# Patient Record
Sex: Female | Born: 1975 | Race: Black or African American | Hispanic: No | Marital: Married | Smoking: Never smoker
Health system: Southern US, Community
[De-identification: ages and names within clinical notes are randomized; demographics above are authoritative.]

## PROBLEM LIST (undated history)

## (undated) DIAGNOSIS — I10 Essential (primary) hypertension: Secondary | ICD-10-CM

## (undated) HISTORY — DX: Essential (primary) hypertension: I10

---

## 2011-06-18 NOTE — L&D Delivery Note (Signed)
Delivery Note At 4:31 AM a viable and healthy female was delivered via Vaginal, Spontaneous Delivery (Presentation: Left Occiput Anterior).  APGAR: 9, 9; weight .   No difficulty with shoulders.  Placenta status: , Spontaneous.  Cord: 3 vessels with the following complications: None.    Anesthesia: None  Episiotomy: None Lacerations: None Suture Repair:   Est. Blood Loss (mL): 200  Mom to postpartum.  Baby to nursery-stable.  [facility] 12/19/2011, 4:51 AM

## 2011-07-08 ENCOUNTER — Emergency Department (HOSPITAL_COMMUNITY)
Admission: EM | Admit: 2011-07-08 | Discharge: 2011-07-08 | Disposition: A | Discharge status: Home / Self Care | Payer: Medicaid Other | Source: Home / Self Care | Attending: Emergency Medicine | Admitting: Emergency Medicine

## 2011-07-08 ENCOUNTER — Encounter (HOSPITAL_COMMUNITY): Payer: Self-pay | Admitting: *Deleted

## 2011-07-08 DIAGNOSIS — J069 Acute upper respiratory infection, unspecified: Secondary | ICD-10-CM

## 2011-07-08 DIAGNOSIS — O219 Vomiting of pregnancy, unspecified: Secondary | ICD-10-CM

## 2011-07-08 LAB — POCT URINALYSIS DIP (DEVICE)
Glucose, UA: NEGATIVE mg/dL
Leukocytes, UA: NEGATIVE
Nitrite: NEGATIVE

## 2011-07-08 LAB — URINE CULTURE: Colony Count: 30000

## 2011-07-08 LAB — POCT I-STAT, CHEM 8
HCT: 35 % — ABNORMAL LOW (ref 36.0–46.0)
Hemoglobin: 11.9 g/dL — ABNORMAL LOW (ref 12.0–15.0)
Potassium: 3.7 mEq/L (ref 3.5–5.1)
Sodium: 138 mEq/L (ref 135–145)
TCO2: 23 mmol/L (ref 0–100)

## 2011-07-08 MED ORDER — ONDANSETRON 8 MG PO TBDP: 8.0000 mg | ORAL_TABLET | Freq: Three times a day (TID) | ORAL | Status: AC | PRN

## 2011-07-08 NOTE — ED Provider Notes (Signed)
History     CSN: [id number]  Arrival date & time 07/08/11  1427   First MD Initiated Contact with Patient 07/08/11 1627      Chief Complaint  Patient presents with  . Emesis During Pregnancy    (Consider location/radiation/quality/duration/timing/severity/associated sxs/prior treatment) HPI Comments: Andrea Lucas is 4 months pregnant. She doesn't have an obstetrician and has gotten no prenatal care so far, but does have an appointment to fill out some paperwork on January 31. Ever since the onset of her pregnancy she's had vomiting in the morning and after she eats. Despite this hasn't lost any weight in fact she has been gaining. She had not had any prior history of vomiting up blood, this morning she vomited several times and then vomited up about a teaspoon of bright red blood. She's not had any vomiting of blood since then and denies any abdominal pain or melena.  Also for the past 2 weeks she's had some mild URI symptoms with initially some nasal congestion, rhinorrhea, and scratchy throat. Now she is just left with a dry cough but no wheezing or shortness of breath. She has a headache whenever she coughs. For the past week she's had some pain in her posterior rib cages bilaterally and bilateral flanks.   History reviewed. No pertinent past medical history.  History reviewed. No pertinent past surgical history.  History reviewed. No pertinent family history.  History  Substance Use Topics  . Smoking status: Not on file  . Smokeless tobacco: Not on file  . Alcohol Use:     OB History    Grav Para Term Preterm Abortions TAB SAB Ect Mult Living   1               Review of Systems  Constitutional: Negative for fever, chills, appetite change, fatigue and unexpected weight change.  HENT: Negative for ear pain, congestion, sore throat, rhinorrhea, sneezing, neck stiffness, voice change and postnasal drip.   Eyes: Negative for pain, discharge and redness.  Respiratory: Positive  for cough. Negative for chest tightness, shortness of breath and wheezing.   Cardiovascular: Negative for chest pain.  Gastrointestinal: Positive for nausea and vomiting. Negative for abdominal pain, diarrhea, constipation, blood in stool, abdominal distention, anal bleeding and rectal pain.  Genitourinary: Negative for dysuria, urgency and frequency.  Skin: Negative for rash.  Neurological: Positive for headaches.    Allergies  Review of patient's allergies indicates no known allergies.  Home Medications   Current Outpatient Rx  Name Route Sig Dispense Refill  . ONDANSETRON 8 MG PO TBDP Oral Take 1 tablet (8 mg total) by mouth every 8 (eight) hours as needed for nausea. 20 tablet 0    BP 140/70  Pulse 108  Temp(Src) 98.9 F (37.2 C) (Oral)  Resp 20  SpO2 100%  Filed Vitals:   07/08/11 1553 07/08/11 1731 07/08/11 1732 07/08/11 1734  BP: 137/84 129/80 136/77 140/70  Pulse: 102 91 90 108  Temp: 98.9 F (37.2 C)     TempSrc: Oral     Resp: 20     SpO2: 100%       Physical Exam  Nursing note and vitals reviewed. Constitutional: She appears well-developed and well-nourished. No distress.  HENT:  Head: Normocephalic and atraumatic.  Right Ear: External ear normal.  Left Ear: External ear normal.  Nose: Nose normal.  Mouth/Throat: Oropharynx is clear and moist. No oropharyngeal exudate.  Eyes: Conjunctivae and EOM are normal. Pupils are equal, round, and reactive to light.  Right eye exhibits no discharge. Left eye exhibits no discharge. No scleral icterus.  Neck: Normal range of motion. Neck supple.  Cardiovascular: Normal rate, regular rhythm and normal heart sounds.  Exam reveals no gallop and no friction rub.   No murmur heard. Pulmonary/Chest: Effort normal and breath sounds normal. No stridor. No respiratory distress. She has no wheezes. She has no rales. She exhibits no tenderness.  Abdominal: Soft. Bowel sounds are normal. She exhibits mass (there is a gravid uterus  palpable just below the umbilicus at the midline. This is nontender.). She exhibits no distension. There is no hepatosplenomegaly. There is no tenderness. There is no rebound, no guarding and no CVA tenderness.  Genitourinary:       Digital rectal exam reveals no masses or tenderness and heme-negative stool.  Lymphadenopathy:    She has no cervical adenopathy.  Skin: Skin is warm and dry. No rash noted. She is not diaphoretic.    ED Course  Procedures (including critical care time)  Results for orders placed during the hospital encounter of 07/08/11  POCT URINALYSIS DIP (DEVICE)      Component Value Range   Glucose, UA NEGATIVE  NEGATIVE (mg/dL)   Bilirubin Urine SMALL (*) NEGATIVE    Ketones, ur TRACE (*) NEGATIVE (mg/dL)   Specific Gravity, Urine 1.025  1.005 - 1.030    Hgb urine dipstick NEGATIVE  NEGATIVE    pH 7.0  5.0 - 8.0    Protein, ur 30 (*) NEGATIVE (mg/dL)   Urobilinogen, UA 2.0 (*) 0.0 - 1.0 (mg/dL)   Nitrite NEGATIVE  NEGATIVE    Leukocytes, UA NEGATIVE  NEGATIVE   POCT PREGNANCY, URINE      Component Value Range   Preg Test, Ur POSITIVE    POCT I-STAT, CHEM 8      Component Value Range   Sodium 138  135 - 145 (mEq/L)   Potassium 3.7  3.5 - 5.1 (mEq/L)   Chloride 103  96 - 112 (mEq/L)   BUN 3 (*) 6 - 23 (mg/dL)   Creatinine, Ser 1.61  0.50 - 1.10 (mg/dL)   Glucose, Bld 096 (*) 70 - 99 (mg/dL)   Calcium, Ion 0.45  4.09 - 1.32 (mmol/L)   TCO2 23  0 - 100 (mmol/L)   Hemoglobin 11.9 (*) 12.0 - 15.0 (g/dL)   HCT 81.1 (*) 91.4 - 46.0 (%)  OCCULT BLOOD, POC DEVICE      Component Value Range   Fecal Occult Bld NEGATIVE       Labs Reviewed  POCT URINALYSIS DIP (DEVICE) - Abnormal; Notable for the following:    Bilirubin Urine SMALL (*)    Ketones, ur TRACE (*)    Protein, ur 30 (*)    Urobilinogen, UA 2.0 (*)    All other components within normal limits  POCT I-STAT, CHEM 8 - Abnormal; Notable for the following:    BUN 3 (*)    Glucose, Bld 109 (*)     Hemoglobin 11.9 (*)    HCT 35.0 (*)    All other components within normal limits  POCT PREGNANCY, URINE  OCCULT BLOOD, POC DEVICE  I-STAT, CHEM 8  POCT URINALYSIS DIPSTICK  URINE CULTURE  POCT OCCULT BLOOD STOOL, DEVICE   No results found.   1. Upper respiratory infection   2. Pregnancy, excessive vomiting       MDM  There is no evidence that she has any significant GI bleeding based on heme negative stools, near-normal hemoglobin and hematocrit  on her i-STAT, and no postural drop. The blood was probably due to hyperemesis gravidarum, which may cause rupture of a small blood vessel or a tiny tear in her esophagus. She was told that they should get worse or persist she should go directly to the emergency room.  She also appears to have an upper respiratory infection. Since she is pregnant I am not inclined to give her any cough medication but have suggested a couple of over-the-counter things that she could take that would be harmful to take during pregnancy.        Roque Lias, MD 07/08/11 2055

## 2011-07-08 NOTE — ED Notes (Signed)
pT  IS  4  MONTHS  PREGNANT  SHE  REPORTS  SYMPTOMS  OF  NAUSEA      LOW  BACK /  FLANK  PAINS  AS  WELL  AS  LINGERING  SYMPTOMS  OF  CONGESTION  AND  URI  SYMPTOMS      PT  REPORTS  HAS  BEEN  SYMPTAMATIC  FOR  2  WEEKS

## 2011-07-20 LAB — RUBELLA ANTIBODY, IGM: Rubella: IMMUNE

## 2011-07-24 ENCOUNTER — Other Ambulatory Visit (HOSPITAL_COMMUNITY): Payer: Self-pay | Admitting: Family Medicine

## 2011-07-24 ENCOUNTER — Other Ambulatory Visit: Payer: Self-pay | Admitting: Family Medicine

## 2011-07-24 DIAGNOSIS — Z3689 Encounter for other specified antenatal screening: Secondary | ICD-10-CM

## 2011-07-24 DIAGNOSIS — O09529 Supervision of elderly multigravida, unspecified trimester: Secondary | ICD-10-CM

## 2011-07-24 LAB — ABO/RH: RH Type: POSITIVE

## 2011-07-24 LAB — CBC: Platelets: 253 10*3/uL (ref 150–399)

## 2011-07-24 LAB — GC/CHLAMYDIA PROBE AMP, GENITAL
Chlamydia: NEGATIVE
Gonorrhea: NEGATIVE

## 2011-07-24 LAB — ANTIBODY SCREEN: Antibody Screen: NEGATIVE

## 2011-07-25 LAB — HIV ANTIBODY (ROUTINE TESTING W REFLEX): HIV: NONREACTIVE

## 2011-07-26 ENCOUNTER — Other Ambulatory Visit: Payer: Self-pay

## 2011-07-26 LAB — CHG HEMOGLOBIN ELECTROPHORESIS

## 2011-07-31 ENCOUNTER — Ambulatory Visit (HOSPITAL_COMMUNITY)
Admission: RE | Admit: 2011-07-31 | Discharge: 2011-07-31 | Disposition: A | Discharge status: Home / Self Care | Payer: Medicaid Other | Source: Ambulatory Visit | Attending: Family Medicine | Admitting: Family Medicine

## 2011-07-31 DIAGNOSIS — O358XX0 Maternal care for other (suspected) fetal abnormality and damage, not applicable or unspecified: Secondary | ICD-10-CM | POA: Insufficient documentation

## 2011-07-31 DIAGNOSIS — Z363 Encounter for antenatal screening for malformations: Secondary | ICD-10-CM | POA: Insufficient documentation

## 2011-07-31 DIAGNOSIS — O10019 Pre-existing essential hypertension complicating pregnancy, unspecified trimester: Secondary | ICD-10-CM | POA: Insufficient documentation

## 2011-07-31 DIAGNOSIS — Z3689 Encounter for other specified antenatal screening: Secondary | ICD-10-CM

## 2011-07-31 DIAGNOSIS — O09529 Supervision of elderly multigravida, unspecified trimester: Secondary | ICD-10-CM

## 2011-07-31 DIAGNOSIS — O0930 Supervision of pregnancy with insufficient antenatal care, unspecified trimester: Secondary | ICD-10-CM | POA: Insufficient documentation

## 2011-07-31 DIAGNOSIS — E669 Obesity, unspecified: Secondary | ICD-10-CM | POA: Insufficient documentation

## 2011-07-31 DIAGNOSIS — Z1389 Encounter for screening for other disorder: Secondary | ICD-10-CM | POA: Insufficient documentation

## 2011-07-31 NOTE — Progress Notes (Addendum)
Genetic Counseling  High-Risk Gestation Note  Appointment Date:  07/31/2011 Referred By: Reva Bores, MD Date of Birth:  1975/08/25   Pregnancy History: G1P0 Estimated Date of Delivery: 12/23/11 Estimated Gestational Age: [redacted]w[redacted]d  Ms. Andrea Lucas was seen for genetic counseling regarding a maternal age of 36 y.o.  She was counseled regarding maternal age and the association with risk for chromosome conditions due to nondisjunction with aging of the ova.   We reviewed chromosomes, nondisjunction, and the associated 1 in 100 risk for fetal aneuploidy related to a maternal age of 36 y.o. at [redacted]w[redacted]d gestation.  She was counseled that the risk for aneuploidy decreases as gestational age increases, accounting for those pregnancies which spontaneously abort.  We specifically discussed Down syndrome (trisomy 41), trisomies 74 and 78, and sex chromosome aneuploidies (47,XXX and 47,XXY) including the common features and prognoses of each.   We also reviewed [name]'s maternal serum Quad screen result and the associated reduction in risks for fetal Down syndrome (1 in 279 to 1 in 10,000), trisomy 18 (1 in 840 to 1 in 10,000), and ONTDs.  She understands that Quad screening provides a pregnancy specific risk for Down syndrome, but is not considered to be diagnostic.  She was counseled regarding other available screening and diagnostic options including ultrasound, cell free fetal DNA testing, and amniocentesis.  The risks, benefits, and limitations of each of these options were reviewed in detail.  After thoughtful consideration of these options, she elected to proceed with detailed ultrasound, but declined cell free fetal DNA testing and amniocentesis.  She understands that screening tests cannot rule out all birth defects or genetic syndromes.  The patient was advised of this limitation and states she still does not want diagnostic testing at this time.  However, she was counseled that 50-80% of fetuses with  Down syndrome and up to 90% of fetuses with trisomies 13 and 18, when well visualized, have detectable anomalies or soft markers by ultrasound. A complete detailed ultrasound was performed today.  The ultrasound report will be documented separately.  [name] was provided with written information regarding sickle cell anemia (SCA) including the carrier frequency and incidence in the African-American population, the availability of carrier testing and prenatal diagnosis if indicated.  In addition, we discussed that hemoglobinopathies are routinely screened for as part of the [place] newborn screening panel.  She declined hemoglobin electrophoresis today.   Both family histories were reviewed and found to be noncontributory for birth defects, mental retardation, and known genetic conditions. Without further information regarding the provided family history, an accurate genetic risk cannot be calculated. Further genetic counseling is warranted if more information is obtained.  [name] denied exposure to environmental toxins or chemical agents. She denied the use of alcohol, tobacco or street drugs. She denied significant viral illnesses during the course of her pregnancy.   I counseled [name] regarding the above risks and available options.  The approximate face-to-face time with the genetic counselor was 38 minutes.  Despina Arias, MS Certified Genetic Counselor

## 2011-08-06 DIAGNOSIS — O09529 Supervision of elderly multigravida, unspecified trimester: Secondary | ICD-10-CM

## 2011-08-06 DIAGNOSIS — O0990 Supervision of high risk pregnancy, unspecified, unspecified trimester: Secondary | ICD-10-CM | POA: Insufficient documentation

## 2011-08-06 DIAGNOSIS — O169 Unspecified maternal hypertension, unspecified trimester: Secondary | ICD-10-CM | POA: Insufficient documentation

## 2011-08-06 DIAGNOSIS — I10 Essential (primary) hypertension: Secondary | ICD-10-CM

## 2011-08-06 NOTE — Assessment & Plan Note (Signed)
Declined quad screen 1 hour glucose 124 Declined flu vaccine

## 2011-08-08 ENCOUNTER — Ambulatory Visit (INDEPENDENT_AMBULATORY_CARE_PROVIDER_SITE_OTHER): Payer: Medicaid Other | Admitting: Physician Assistant

## 2011-08-08 ENCOUNTER — Encounter: Payer: Self-pay | Admitting: Physician Assistant

## 2011-08-08 DIAGNOSIS — K219 Gastro-esophageal reflux disease without esophagitis: Secondary | ICD-10-CM

## 2011-08-08 DIAGNOSIS — O169 Unspecified maternal hypertension, unspecified trimester: Secondary | ICD-10-CM

## 2011-08-08 DIAGNOSIS — O09529 Supervision of elderly multigravida, unspecified trimester: Secondary | ICD-10-CM

## 2011-08-08 DIAGNOSIS — I10 Essential (primary) hypertension: Secondary | ICD-10-CM

## 2011-08-08 LAB — POCT URINALYSIS DIP (DEVICE)
Bilirubin Urine: NEGATIVE
Glucose, UA: NEGATIVE mg/dL
Leukocytes, UA: NEGATIVE
Nitrite: NEGATIVE
Urobilinogen, UA: 1 mg/dL (ref 0.0–1.0)
pH: 6.5 (ref 5.0–8.0)

## 2011-08-08 NOTE — Progress Notes (Signed)
Transfer of care from [facility] secondary to chronic HTN. No medications. No complaints. + FM, Baseline labs today. Rec: 1-2 x weekly checking BP at [facility]. Report BP > 150/100

## 2011-08-08 NOTE — Progress Notes (Signed)
P=91, given influenza information

## 2011-08-08 NOTE — Patient Instructions (Signed)
Hypertension As your heart beats, it forces blood through your arteries. This force is your blood pressure. If the pressure is too high, it is called hypertension (HTN) or high blood pressure. HTN is dangerous because you may have it and not know it. High blood pressure may mean that your heart has to work harder to pump blood. Your arteries may be narrow or stiff. The extra work puts you at risk for heart disease, stroke, and other problems.  Blood pressure consists of two numbers, a higher number over a lower, 110/72, for example. It is stated as "110 over 72." The ideal is below 120 for the top number (systolic) and under 80 for the bottom (diastolic). Write down your blood pressure today. You should pay close attention to your blood pressure if you have certain conditions such as:  Heart failure.   Prior heart attack.   Diabetes   Chronic kidney disease.   Prior stroke.   Multiple risk factors for heart disease.  To see if you have HTN, your blood pressure should be measured while you are seated with your arm held at the level of the heart. It should be measured at least twice. A one-time elevated blood pressure reading (especially in the Emergency Department) does not mean that you need treatment. There may be conditions in which the blood pressure is different between your right and left arms. It is important to see your caregiver soon for a recheck. Most people have essential hypertension which means that there is not a specific cause. This type of high blood pressure may be lowered by changing lifestyle factors such as:  Stress.   Smoking.   Lack of exercise.   Excessive weight.   Drug/tobacco/alcohol use.   Eating less salt.  Most people do not have symptoms from high blood pressure until it has caused damage to the body. Effective treatment can often prevent, delay or reduce that damage. TREATMENT  When a cause has been identified, treatment for high blood pressure is  directed at the cause. There are a large number of medications to treat HTN. These fall into several categories, and your caregiver will help you select the medicines that are best for you. Medications may have side effects. You should review side effects with your caregiver. If your blood pressure stays high after you have made lifestyle changes or started on medicines,   Your medication(s) may need to be changed.   Other problems may need to be addressed.   Be certain you understand your prescriptions, and know how and when to take your medicine.   Be sure to follow up with your caregiver within the time frame advised (usually within two weeks) to have your blood pressure rechecked and to review your medications.   If you are taking more than one medicine to lower your blood pressure, make sure you know how and at what times they should be taken. Taking two medicines at the same time can result in blood pressure that is too low.  SEEK IMMEDIATE MEDICAL CARE IF:  You develop a severe headache, blurred or changing vision, or confusion.   You have unusual weakness or numbness, or a faint feeling.   You have severe chest or abdominal pain, vomiting, or breathing problems.  MAKE SURE YOU:   Understand these instructions.   Will watch your condition.   Will get help right away if you are not doing well or get worse.  Document Released: 06/03/2005 Document Revised: 02/13/2011 Document Reviewed:   01/22/2008 [employer] Patient Information 2012 [employer].24-Hour Urine Collection HOME CARE  When you get up in the morning on the day you do this test, pee (urinate) in the toilet and flush. Make a note of the time. This will be your start time on the day of collection and the end time on the next morning.   From then on, save all your pee (urine) in the plastic jug that was given to you.   You should stop collecting your pee 24 hours after you started.   If the plastic jug that is given to  you already has liquid in it, that is okay. Do not throw out the liquid or rinse out the jug. Some tests need the liquid to be added to your pee.   Keep your plastic jug cool (in an ice chest or the refrigerator) during the test.   When the 24 hours is over, bring your plastic jug to the clinic lab. Keep the jug cool (in an ice chest) while you are bringing it to the lab.  Document Released: 08/30/2008 Document Revised: 02/13/2011 Document Reviewed: 08/30/2008 [facility] Patient Information 2012 [place], [place].

## 2011-08-12 ENCOUNTER — Other Ambulatory Visit: Payer: Medicaid Other

## 2011-08-12 DIAGNOSIS — O09529 Supervision of elderly multigravida, unspecified trimester: Secondary | ICD-10-CM

## 2011-08-12 DIAGNOSIS — I10 Essential (primary) hypertension: Secondary | ICD-10-CM

## 2011-08-12 LAB — CBC
HCT: 29.1 % — ABNORMAL LOW (ref 36.0–46.0)
Hemoglobin: 9 g/dL — ABNORMAL LOW (ref 12.0–15.0)
MCV: 80.8 fL (ref 78.0–100.0)
RDW: 13.7 % (ref 11.5–15.5)
WBC: 7.3 10*3/uL (ref 4.0–10.5)

## 2011-08-12 LAB — COMPREHENSIVE METABOLIC PANEL
Albumin: 3.6 g/dL (ref 3.5–5.2)
BUN: 7 mg/dL (ref 6–23)
CO2: 23 mEq/L (ref 19–32)
Calcium: 9 mg/dL (ref 8.4–10.5)
Chloride: 105 mEq/L (ref 96–112)
Creat: 0.55 mg/dL (ref 0.50–1.10)
Glucose, Bld: 87 mg/dL (ref 70–99)
Potassium: 3.9 mEq/L (ref 3.5–5.3)

## 2011-08-13 LAB — CREATININE CLEARANCE, URINE, 24 HOUR
Creatinine Clearance: 277 mL/min — ABNORMAL HIGH (ref 75–115)
Creatinine: 0.55 mg/dL (ref 0.50–1.10)

## 2011-08-14 DIAGNOSIS — O0990 Supervision of high risk pregnancy, unspecified, unspecified trimester: Secondary | ICD-10-CM

## 2011-08-14 NOTE — Assessment & Plan Note (Signed)
Per Hemoglobin Electrophoresis has A+ Variant, had Negative Quad screen 07/24/11.

## 2011-08-29 ENCOUNTER — Ambulatory Visit (INDEPENDENT_AMBULATORY_CARE_PROVIDER_SITE_OTHER): Payer: Medicaid Other | Admitting: Obstetrics and Gynecology

## 2011-08-29 VITALS — BP 133/89 | HR 98 | Temp 97.3°F | Wt 209.3 lb

## 2011-08-29 DIAGNOSIS — I10 Essential (primary) hypertension: Secondary | ICD-10-CM

## 2011-08-29 DIAGNOSIS — O169 Unspecified maternal hypertension, unspecified trimester: Secondary | ICD-10-CM

## 2011-08-29 DIAGNOSIS — O0990 Supervision of high risk pregnancy, unspecified, unspecified trimester: Secondary | ICD-10-CM

## 2011-08-29 DIAGNOSIS — O09529 Supervision of elderly multigravida, unspecified trimester: Secondary | ICD-10-CM

## 2011-08-29 LAB — POCT URINALYSIS DIP (DEVICE)
Glucose, UA: NEGATIVE mg/dL
Ketones, ur: NEGATIVE mg/dL
Protein, ur: NEGATIVE mg/dL
Specific Gravity, Urine: >=1.03 (ref 1.005–1.030)

## 2011-08-29 NOTE — Progress Notes (Signed)
Patient doing well without complaints. Patient desires BTL that is 100% effective. Explained failure rate of each method. Also discussed Essure and need for confirmatory HSG which will be covered by family planning medicaid, if she enrolls following pregnancy.

## 2011-08-29 NOTE — Progress Notes (Signed)
Pulse- 98 

## 2011-09-12 ENCOUNTER — Encounter: Payer: Self-pay | Admitting: Family Medicine

## 2011-09-12 ENCOUNTER — Ambulatory Visit (INDEPENDENT_AMBULATORY_CARE_PROVIDER_SITE_OTHER): Payer: Medicaid Other | Admitting: Family Medicine

## 2011-09-12 VITALS — BP 132/80 | Wt 208.6 lb

## 2011-09-12 DIAGNOSIS — K219 Gastro-esophageal reflux disease without esophagitis: Secondary | ICD-10-CM

## 2011-09-12 DIAGNOSIS — O169 Unspecified maternal hypertension, unspecified trimester: Secondary | ICD-10-CM

## 2011-09-12 DIAGNOSIS — O09529 Supervision of elderly multigravida, unspecified trimester: Secondary | ICD-10-CM

## 2011-09-12 DIAGNOSIS — O0990 Supervision of high risk pregnancy, unspecified, unspecified trimester: Secondary | ICD-10-CM

## 2011-09-12 DIAGNOSIS — I10 Essential (primary) hypertension: Secondary | ICD-10-CM

## 2011-09-12 LAB — POCT URINALYSIS DIP (DEVICE)
Bilirubin Urine: NEGATIVE
Glucose, UA: NEGATIVE mg/dL
Hgb urine dipstick: NEGATIVE
Specific Gravity, Urine: 1.015 (ref 1.005–1.030)

## 2011-09-12 MED ORDER — RANITIDINE HCL 150 MG PO TABS: 150.0000 mg | ORAL_TABLET | Freq: Two times a day (BID) | ORAL | Status: DC

## 2011-09-12 NOTE — Progress Notes (Signed)
Heartburn worse at night, but also having some vomiting during day.  Will start Zantac.  Will check urine culture.  No other complaints.  F/U 2 weeks.

## 2011-09-12 NOTE — Progress Notes (Signed)
No vaginal discharge. Pulse 103.

## 2011-09-12 NOTE — Patient Instructions (Signed)
Heartburn During Pregnancy  Heartburn is a burning sensation in the chest caused by stomach acid backing up into the esophagus. Heartburn (also known as "reflux") is common in pregnancy because a certain hormone (progesterone) changes. The progesterone hormone may relax the valve that separates the esophagus from the stomach. This allows acid to go up into the esophagus, causing heartburn. Heartburn may also happen in pregnancy because the enlarging uterus pushes up on the stomach, which pushes more acid into the esophagus. This is especially true in the later stages of pregnancy. Heartburn problems usually go away after giving birth. CAUSES   The progesterone hormone.   Changing hormone levels.   The growing uterus that pushes stomach acid upward.   Large meals.   Certain foods and drinks.   Exercise.   Increased acid production.  SYMPTOMS   Burning pain in the chest or lower throat.   Bitter taste in the mouth.   Coughing.  DIAGNOSIS  Heartburn is typically diagnosed by your caregiver when taking a careful history of your concern. Your caregiver may order a blood test to check for a certain type of bacteria that is associated with heartburn. Sometimes, heartburn is diagnosed by prescribing a heartburn medicine to see if the symptoms improve. It is rare in pregnancy to have a procedure called an endoscopy. This is when a tube with a light and a camera on the end is used to examine the esophagus and the stomach. TREATMENT   Your caregiver may tell you to use certain over-the-counter medicines (antacids, acid reducers) for mild heartburn.   Your caregiver may prescribe medicines to decrease stomach acid or to protect your stomach lining.   Your caregiver may recommend certain diet changes.   For severe cases, your caregiver may recommend that the head of the bed be elevated on blocks. (Sleeping with more pillows is not an effective treatment as it only changes the position of your  head and does not improve the main problem of stomach acid refluxing into the esophagus.)  HOME CARE INSTRUCTIONS   Take all medicines as directed by your caregiver.   Raise the head of your bed by putting blocks under the legs if instructed to by your caregiver.   Do not exercise right after eating.   Avoid eating 2 or 3 hours before bed. Do not lie down right after eating.   Eat small meals throughout the day instead of 3 large meals.   Identify foods and beverages that make your symptoms worse and avoid them. Foods you may want to avoid include:   Peppers.   Chocolate.   High-fat foods, including fried foods.   Spicy foods.   Garlic and onions.   Citrus fruits, including oranges, grapefruit, lemons, and limes.   Food containing tomatoes or tomato products.   Mint.   Carbonated and caffeinated drinks.   Vinegar.  SEEK IMMEDIATE MEDICAL CARE IF:   You have severe chest pain that goes down your arm or into your jaw or neck.   You feel sweaty, dizzy, or lightheaded.   You become short of breath.   You vomit blood.   You have difficulty or pain with swallowing.   You have bloody or black, tarry stools.   You have episodes of heartburn more than 3 times a week, for more than 2 weeks.  MAKE SURE YOU:  Understand these instructions.   Will watch your condition.   Will get help right away if you are not doing well or   get worse.  Document Released: 05/31/2000 Document Revised: 05/23/2011 Document Reviewed: 11/22/2010 [employer] Patient Information 2012 [employer]. 

## 2011-09-16 ENCOUNTER — Other Ambulatory Visit: Payer: Self-pay | Admitting: Family Medicine

## 2011-09-16 LAB — CULTURE, OB URINE: Colony Count: >=100000

## 2011-09-16 MED ORDER — CEPHALEXIN 500 MG PO CAPS: 500.0000 mg | ORAL_CAPSULE | Freq: Four times a day (QID) | ORAL | Status: DC

## 2011-09-16 NOTE — Progress Notes (Signed)
Patient called and notified of UTI and Rx sent to pharmacy. Patient agrees.

## 2011-09-26 ENCOUNTER — Ambulatory Visit (INDEPENDENT_AMBULATORY_CARE_PROVIDER_SITE_OTHER): Payer: Medicaid Other | Admitting: Advanced Practice Midwife

## 2011-09-26 VITALS — BP 141/76 | Temp 97.6°F | Wt 214.3 lb

## 2011-09-26 DIAGNOSIS — D649 Anemia, unspecified: Secondary | ICD-10-CM | POA: Insufficient documentation

## 2011-09-26 DIAGNOSIS — O0990 Supervision of high risk pregnancy, unspecified, unspecified trimester: Secondary | ICD-10-CM

## 2011-09-26 DIAGNOSIS — O09529 Supervision of elderly multigravida, unspecified trimester: Secondary | ICD-10-CM

## 2011-09-26 DIAGNOSIS — O10019 Pre-existing essential hypertension complicating pregnancy, unspecified trimester: Secondary | ICD-10-CM

## 2011-09-26 DIAGNOSIS — O10919 Unspecified pre-existing hypertension complicating pregnancy, unspecified trimester: Secondary | ICD-10-CM

## 2011-09-26 DIAGNOSIS — I10 Essential (primary) hypertension: Secondary | ICD-10-CM

## 2011-09-26 LAB — CBC
Platelets: 246 10*3/uL (ref 150–400)
RBC: 3.36 MIL/uL — ABNORMAL LOW (ref 3.87–5.11)
WBC: 7.4 10*3/uL (ref 4.0–10.5)

## 2011-09-26 LAB — POCT URINALYSIS DIP (DEVICE)
Bilirubin Urine: NEGATIVE
Glucose, UA: NEGATIVE mg/dL
Leukocytes, UA: NEGATIVE
Nitrite: NEGATIVE

## 2011-09-26 NOTE — Patient Instructions (Signed)
Hormonal Contraception Information Estrogen and progesterone (progestin) are hormones used in many forms of birth control (contraception). These 2 hormones make up most hormonal contraceptives. Hormonal contraceptives use either:   A combination of estrogen hormone and progesterone hormone in the form of a:   Pill. Typical pill packs include 21 days of active hormone pills and 7 days of non-hormonal pills.During the non-hormone week, you will have your period. There are certain types of pills that include more days of active hormones.   Patch. The patch is placed on the lower abdomen every week for 3 weeks, and not on the fourth week.   Vaginal ring. The ring is placed in the vagina and left there for 3 weeks, and removed for 1 week.   Progesterone alone in the form of a(n):   Pill. Hormone pills are taken every day of the cycle.   Intrauterine device (IUD). The IUD is inserted during a menstrual period and removed or replaced every 5 years or less.   Implant. Plastic rods are placed under the skin of the upper arm and are removed or replaced every 3 years or less.   Injection. The injection is given once every 90 days.  Pregnancy can still occur with any of these hormonal contraceptive methods. If there is any suspicion of pregnancy, take a pregnancy test and talk to your caregiver. ESTROGEN AND PROGESTERONE CONTRACEPTIVES Estrogen and progesterone contraceptives can prevent pregnancy by:  Stopping the actions of other reproductive hormones.    Stopping the release of an egg (ovulation).   Changing the lining of the uterus. This change makes it more difficult for an egg to implant.  Side effects from estrogen occur more often in the first 2 or 3 months. Talk to your caregiver about what side effects may affect you. If you develop persistent side effects or they are severe, talk to your caregiver. PROGESTERONE CONTRACEPTIVES Progesterone only contraceptives can prevent pregnancy by:     Blocking ovulation.   Preventing the entry of sperm into the uterus by keeping the cervical mucus thick and sticky.   Slowing the action of fallopian tubes to slow sperm transport.   Changing the lining of the uterus. This change makes it more difficult for an egg to implant.  Side effects of progesterone can vary. Talk to your caregiver about what side effects may affect you. If you develop persistent side effects or they are severe, talk to your caregiver. Document Released: 06/23/2007 Document Revised: 05/23/2011 Document Reviewed: 10/06/2010 [employer] Patient Information 2012 [employer]. 

## 2011-09-26 NOTE — Progress Notes (Signed)
Pulse: 100 28 week labs today 1hr gtt due at 1135

## 2011-09-27 LAB — RPR

## 2011-10-02 ENCOUNTER — Encounter: Payer: Self-pay | Admitting: Advanced Practice Midwife

## 2011-10-02 NOTE — Progress Notes (Signed)
28 week labs. No PIH Sx. Schedule growth [place].

## 2011-10-10 ENCOUNTER — Ambulatory Visit (INDEPENDENT_AMBULATORY_CARE_PROVIDER_SITE_OTHER): Payer: Medicaid Other | Admitting: Obstetrics and Gynecology

## 2011-10-10 ENCOUNTER — Encounter: Payer: Medicaid Other | Admitting: Advanced Practice Midwife

## 2011-10-10 ENCOUNTER — Encounter: Payer: Self-pay | Admitting: Obstetrics and Gynecology

## 2011-10-10 VITALS — BP 131/81 | Temp 98.3°F | Wt 213.4 lb

## 2011-10-10 DIAGNOSIS — O09529 Supervision of elderly multigravida, unspecified trimester: Secondary | ICD-10-CM

## 2011-10-10 DIAGNOSIS — O0990 Supervision of high risk pregnancy, unspecified, unspecified trimester: Secondary | ICD-10-CM

## 2011-10-10 DIAGNOSIS — I10 Essential (primary) hypertension: Secondary | ICD-10-CM

## 2011-10-10 DIAGNOSIS — Z302 Encounter for sterilization: Secondary | ICD-10-CM

## 2011-10-10 LAB — POCT URINALYSIS DIP (DEVICE)
Bilirubin Urine: NEGATIVE
Glucose, UA: NEGATIVE mg/dL
Nitrite: NEGATIVE
Urobilinogen, UA: 0.2 mg/dL (ref 0.0–1.0)

## 2011-10-10 NOTE — Progress Notes (Signed)
Patient doing well without complaints. Will schedule follow-up growth ultrasound today. Patient planning on using OCP for BCM.

## 2011-10-10 NOTE — Progress Notes (Signed)
Pulse: 97

## 2011-10-10 NOTE — Progress Notes (Addendum)
U/S scheduled 10/14/11 at 9 15 am.

## 2011-10-14 ENCOUNTER — Ambulatory Visit (HOSPITAL_COMMUNITY)
Admission: RE | Admit: 2011-10-14 | Discharge: 2011-10-14 | Disposition: A | Discharge status: Home / Self Care | Payer: Medicaid Other | Source: Ambulatory Visit | Attending: Obstetrics and Gynecology | Admitting: Obstetrics and Gynecology

## 2011-10-14 DIAGNOSIS — O10019 Pre-existing essential hypertension complicating pregnancy, unspecified trimester: Secondary | ICD-10-CM | POA: Insufficient documentation

## 2011-10-14 DIAGNOSIS — O0990 Supervision of high risk pregnancy, unspecified, unspecified trimester: Secondary | ICD-10-CM

## 2011-10-14 DIAGNOSIS — I10 Essential (primary) hypertension: Secondary | ICD-10-CM

## 2011-10-14 DIAGNOSIS — O09529 Supervision of elderly multigravida, unspecified trimester: Secondary | ICD-10-CM | POA: Insufficient documentation

## 2011-10-24 ENCOUNTER — Ambulatory Visit (INDEPENDENT_AMBULATORY_CARE_PROVIDER_SITE_OTHER): Payer: Medicaid Other | Admitting: Physician Assistant

## 2011-10-24 VITALS — BP 135/84 | Temp 97.7°F | Wt 214.0 lb

## 2011-10-24 DIAGNOSIS — I10 Essential (primary) hypertension: Secondary | ICD-10-CM

## 2011-10-24 DIAGNOSIS — O169 Unspecified maternal hypertension, unspecified trimester: Secondary | ICD-10-CM

## 2011-10-24 LAB — COMPREHENSIVE METABOLIC PANEL
ALT: <8 U/L (ref 0–35)
AST: 11 U/L (ref 0–37)
Albumin: 3.5 g/dL (ref 3.5–5.2)
CO2: 23 mEq/L (ref 19–32)
Calcium: 8.9 mg/dL (ref 8.4–10.5)
Chloride: 104 mEq/L (ref 96–112)
Creat: 0.65 mg/dL (ref 0.50–1.10)
Potassium: 3.8 mEq/L (ref 3.5–5.3)
Sodium: 134 mEq/L — ABNORMAL LOW (ref 135–145)
Total Protein: 6.1 g/dL (ref 6.0–8.3)

## 2011-10-24 LAB — POCT URINALYSIS DIP (DEVICE)
Bilirubin Urine: NEGATIVE
Glucose, UA: NEGATIVE mg/dL
Ketones, ur: NEGATIVE mg/dL
Nitrite: NEGATIVE

## 2011-10-24 NOTE — Progress Notes (Signed)
Addended by: Doreen Salvage on: 10/24/2011 11:35 AM   Modules accepted: Orders

## 2011-10-24 NOTE — Progress Notes (Signed)
Pulse-105 

## 2011-10-24 NOTE — Patient Instructions (Signed)

## 2011-10-25 LAB — CBC
HCT: 26.1 % — ABNORMAL LOW (ref 36.0–46.0)
MCH: 24.2 pg — ABNORMAL LOW (ref 26.0–34.0)
MCV: 77.9 fL — ABNORMAL LOW (ref 78.0–100.0)
RDW: 15.9 % — ABNORMAL HIGH (ref 11.5–15.5)
WBC: 6.4 10*3/uL (ref 4.0–10.5)

## 2011-10-28 ENCOUNTER — Encounter: Payer: Medicaid Other | Admitting: Family Medicine

## 2011-10-28 ENCOUNTER — Ambulatory Visit (INDEPENDENT_AMBULATORY_CARE_PROVIDER_SITE_OTHER): Payer: Medicaid Other | Admitting: Family Medicine

## 2011-10-28 VITALS — BP 138/78 | Temp 97.4°F | Wt 213.5 lb

## 2011-10-28 DIAGNOSIS — O0990 Supervision of high risk pregnancy, unspecified, unspecified trimester: Secondary | ICD-10-CM

## 2011-10-28 DIAGNOSIS — O169 Unspecified maternal hypertension, unspecified trimester: Secondary | ICD-10-CM

## 2011-10-28 DIAGNOSIS — O09529 Supervision of elderly multigravida, unspecified trimester: Secondary | ICD-10-CM

## 2011-10-28 DIAGNOSIS — I10 Essential (primary) hypertension: Secondary | ICD-10-CM

## 2011-10-28 DIAGNOSIS — O10019 Pre-existing essential hypertension complicating pregnancy, unspecified trimester: Secondary | ICD-10-CM

## 2011-10-28 LAB — POCT URINALYSIS DIP (DEVICE)
Bilirubin Urine: NEGATIVE
Glucose, UA: NEGATIVE mg/dL
Ketones, ur: NEGATIVE mg/dL
Nitrite: NEGATIVE
pH: 6 (ref 5.0–8.0)

## 2011-10-28 NOTE — Progress Notes (Signed)
Pulse: 92

## 2011-10-28 NOTE — Patient Instructions (Signed)

## 2011-10-28 NOTE — Progress Notes (Signed)
NST reviewed and reactive. BP is good, Pr/Cr ratio on 5/9 was 0.06

## 2011-10-31 ENCOUNTER — Ambulatory Visit (INDEPENDENT_AMBULATORY_CARE_PROVIDER_SITE_OTHER): Payer: Medicaid Other

## 2011-10-31 DIAGNOSIS — O169 Unspecified maternal hypertension, unspecified trimester: Secondary | ICD-10-CM

## 2011-11-04 ENCOUNTER — Ambulatory Visit (INDEPENDENT_AMBULATORY_CARE_PROVIDER_SITE_OTHER): Payer: Medicaid Other | Admitting: Obstetrics & Gynecology

## 2011-11-04 VITALS — BP 126/72 | Temp 97.9°F | Wt 216.5 lb

## 2011-11-04 DIAGNOSIS — O169 Unspecified maternal hypertension, unspecified trimester: Secondary | ICD-10-CM

## 2011-11-04 DIAGNOSIS — O09529 Supervision of elderly multigravida, unspecified trimester: Secondary | ICD-10-CM

## 2011-11-04 LAB — POCT URINALYSIS DIP (DEVICE)
Bilirubin Urine: NEGATIVE
Glucose, UA: NEGATIVE mg/dL
Hgb urine dipstick: NEGATIVE
Leukocytes, UA: NEGATIVE
Nitrite: NEGATIVE
Protein, ur: 30 mg/dL — AB
Specific Gravity, Urine: 1.025 (ref 1.005–1.030)
Urobilinogen, UA: 1 mg/dL (ref 0.0–1.0)
pH: 6 (ref 5.0–8.0)

## 2011-11-04 NOTE — Progress Notes (Signed)
Pulse: 100

## 2011-11-04 NOTE — Progress Notes (Signed)
Addended by: Jill Side on: 11/04/2011 12:26 PM   Modules accepted: Orders

## 2011-11-04 NOTE — Progress Notes (Signed)
BP good.  Urine not a clean catch.  NST today.   Check BP with each NST until seen again.

## 2011-11-05 NOTE — Progress Notes (Incomplete)
NST from 11/04/11 is reactive NST reactive from 11/07/11

## 2011-11-07 ENCOUNTER — Ambulatory Visit (INDEPENDENT_AMBULATORY_CARE_PROVIDER_SITE_OTHER): Payer: Medicaid Other | Admitting: *Deleted

## 2011-11-07 DIAGNOSIS — O169 Unspecified maternal hypertension, unspecified trimester: Secondary | ICD-10-CM

## 2011-11-07 DIAGNOSIS — O139 Gestational [pregnancy-induced] hypertension without significant proteinuria, unspecified trimester: Secondary | ICD-10-CM

## 2011-11-12 ENCOUNTER — Ambulatory Visit (INDEPENDENT_AMBULATORY_CARE_PROVIDER_SITE_OTHER): Payer: Medicaid Other | Admitting: *Deleted

## 2011-11-12 VITALS — BP 124/72 | Wt 216.1 lb

## 2011-11-12 DIAGNOSIS — O169 Unspecified maternal hypertension, unspecified trimester: Secondary | ICD-10-CM

## 2011-11-12 NOTE — Progress Notes (Signed)
P-101 

## 2011-11-13 ENCOUNTER — Other Ambulatory Visit: Payer: Self-pay | Admitting: *Deleted

## 2011-11-13 ENCOUNTER — Telehealth: Payer: Self-pay | Admitting: *Deleted

## 2011-11-13 DIAGNOSIS — O169 Unspecified maternal hypertension, unspecified trimester: Secondary | ICD-10-CM

## 2011-11-13 NOTE — Telephone Encounter (Signed)
Called pt and informed her that I have scheduled her [place] (AFI) for tomorrow @ 4pm following her NST @ 3pm. Pt voiced understanding.

## 2011-11-14 ENCOUNTER — Ambulatory Visit (INDEPENDENT_AMBULATORY_CARE_PROVIDER_SITE_OTHER): Payer: Medicaid Other | Admitting: *Deleted

## 2011-11-14 ENCOUNTER — Ambulatory Visit (HOSPITAL_COMMUNITY)
Admission: RE | Admit: 2011-11-14 | Discharge: 2011-11-14 | Disposition: A | Discharge status: Home / Self Care | Payer: Medicaid Other | Source: Ambulatory Visit | Attending: Obstetrics & Gynecology | Admitting: Obstetrics & Gynecology

## 2011-11-14 VITALS — BP 111/58 | Temp 98.4°F | Wt 213.7 lb

## 2011-11-14 DIAGNOSIS — O10019 Pre-existing essential hypertension complicating pregnancy, unspecified trimester: Secondary | ICD-10-CM | POA: Insufficient documentation

## 2011-11-14 DIAGNOSIS — O139 Gestational [pregnancy-induced] hypertension without significant proteinuria, unspecified trimester: Secondary | ICD-10-CM

## 2011-11-14 DIAGNOSIS — O09529 Supervision of elderly multigravida, unspecified trimester: Secondary | ICD-10-CM | POA: Insufficient documentation

## 2011-11-14 DIAGNOSIS — O169 Unspecified maternal hypertension, unspecified trimester: Secondary | ICD-10-CM

## 2011-11-14 NOTE — Progress Notes (Addendum)
P=70, here for NST NST reactive [facility]

## 2011-11-17 NOTE — Progress Notes (Signed)
Patient ID: Andrea Lucas, female   DOB: 1975-09-09, 36 y.o.   MRN: [id number] 5/28 NST reviewed and reactive

## 2011-11-17 NOTE — Progress Notes (Signed)
5/16 NSt reviewed and reactive

## 2011-11-18 ENCOUNTER — Other Ambulatory Visit: Payer: Medicaid Other

## 2011-11-21 ENCOUNTER — Ambulatory Visit (INDEPENDENT_AMBULATORY_CARE_PROVIDER_SITE_OTHER): Payer: Medicaid Other | Admitting: *Deleted

## 2011-11-21 VITALS — BP 127/71 | Wt 213.9 lb

## 2011-11-21 DIAGNOSIS — O139 Gestational [pregnancy-induced] hypertension without significant proteinuria, unspecified trimester: Secondary | ICD-10-CM

## 2011-11-21 NOTE — Progress Notes (Signed)
P = 100  Pt missed appt on 6/3 due to exam schedule @ school.

## 2011-11-25 ENCOUNTER — Ambulatory Visit (INDEPENDENT_AMBULATORY_CARE_PROVIDER_SITE_OTHER): Payer: Medicaid Other | Admitting: *Deleted

## 2011-11-25 VITALS — BP 120/67 | Wt 215.6 lb

## 2011-11-25 DIAGNOSIS — O139 Gestational [pregnancy-induced] hypertension without significant proteinuria, unspecified trimester: Secondary | ICD-10-CM

## 2011-11-25 DIAGNOSIS — O169 Unspecified maternal hypertension, unspecified trimester: Secondary | ICD-10-CM

## 2011-11-25 NOTE — Progress Notes (Signed)
P = 116  Pt states she is having difficulty sleeping- advised she may try OTC Benadryl.

## 2011-11-28 ENCOUNTER — Ambulatory Visit (INDEPENDENT_AMBULATORY_CARE_PROVIDER_SITE_OTHER): Payer: Medicaid Other | Admitting: Family Medicine

## 2011-11-28 VITALS — BP 124/73 | Temp 98.8°F | Wt 214.7 lb

## 2011-11-28 DIAGNOSIS — O169 Unspecified maternal hypertension, unspecified trimester: Secondary | ICD-10-CM

## 2011-11-28 DIAGNOSIS — O139 Gestational [pregnancy-induced] hypertension without significant proteinuria, unspecified trimester: Secondary | ICD-10-CM

## 2011-11-28 DIAGNOSIS — O0990 Supervision of high risk pregnancy, unspecified, unspecified trimester: Secondary | ICD-10-CM

## 2011-11-28 LAB — POCT URINALYSIS DIP (DEVICE)
Hgb urine dipstick: NEGATIVE
Nitrite: NEGATIVE
Protein, ur: NEGATIVE mg/dL
Urobilinogen, UA: 0.2 mg/dL (ref 0.0–1.0)
pH: 6 (ref 5.0–8.0)

## 2011-11-28 LAB — OB RESULTS CONSOLE GBS: GBS: NEGATIVE

## 2011-11-28 NOTE — Progress Notes (Signed)
P-85 

## 2011-11-28 NOTE — Progress Notes (Signed)
NST reviewed and reactive. Cultures today U/S growth scheduled Labor precautions

## 2011-11-28 NOTE — Patient Instructions (Addendum)
Contraception Choices Contraception (birth control) is the use of any methods or devices to prevent pregnancy. Below are some methods to help avoid pregnancy. HORMONAL METHODS   Contraceptive implant. This is a thin, plastic tube containing progesterone hormone. It does not contain estrogen hormone. Your caregiver inserts the tube in the inner part of the upper arm. The tube can remain in place for up to 3 years. After 3 years, the implant must be removed. The implant prevents the ovaries from releasing an egg (ovulation), thickens the cervical mucus which prevents sperm from entering the uterus, and thins the lining of the inside of the uterus.   Progesterone-only injections. These injections are given every 3 months by your caregiver to prevent pregnancy. This synthetic progesterone hormone stops the ovaries from releasing eggs. It also thickens cervical mucus and changes the uterine lining. This makes it harder for sperm to survive in the uterus.   Birth control pills. These pills contain estrogen and progesterone hormone. They work by stopping the egg from forming in the ovary (ovulation). Birth control pills are prescribed by a caregiver.Birth control pills can also be used to treat heavy periods.   Minipill. This type of birth control pill contains only the progesterone hormone. They are taken every day of each month and must be prescribed by your caregiver.   Birth control patch. The patch contains hormones similar to those in birth control pills. It must be changed once a week and is prescribed by a caregiver.   Vaginal ring. The ring contains hormones similar to those in birth control pills. It is left in the vagina for 3 weeks, removed for 1 week, and then a new one is put back in place. The patient must be comfortable inserting and removing the ring from the vagina.A caregiver's prescription is necessary.   Emergency contraception. Emergency contraceptives prevent pregnancy after  unprotected sexual intercourse. This pill can be taken right after sex or up to 5 days after unprotected sex. It is most effective the sooner you take the pills after having sexual intercourse. Emergency contraceptive pills are available without a prescription. Check with your pharmacist. Do not use emergency contraception as your only form of birth control.  BARRIER METHODS   Female condom. This is a thin sheath (latex or rubber) that is worn over the penis during sexual intercourse. It can be used with spermicide to increase effectiveness.   Female condom. This is a soft, loose-fitting sheath that is put into the vagina before sexual intercourse.   Diaphragm. This is a soft, latex, dome-shaped barrier that must be fitted by a caregiver. It is inserted into the vagina, along with a spermicidal jelly. It is inserted before intercourse. The diaphragm should be left in the vagina for 6 to 8 hours after intercourse.   Cervical cap. This is a round, soft, latex or plastic cup that fits over the cervix and must be fitted by a caregiver. The cap can be left in place for up to 48 hours after intercourse.   Sponge. This is a soft, circular piece of polyurethane foam. The sponge has spermicide in it. It is inserted into the vagina after wetting it and before sexual intercourse.   Spermicides. These are chemicals that kill or block sperm from entering the cervix and uterus. They come in the form of creams, jellies, suppositories, foam, or tablets. They do not require a prescription. They are inserted into the vagina with an applicator before having sexual intercourse. The process must be   repeated every time you have sexual intercourse.  INTRAUTERINE CONTRACEPTION  Intrauterine device (IUD). This is a T-shaped device that is put in a woman's uterus during a menstrual period to prevent pregnancy. There are 2 types:   Copper IUD. This type of IUD is wrapped in copper wire and is placed inside the uterus. Copper  makes the uterus and fallopian tubes produce a fluid that kills sperm. It can stay in place for 10 years.   Hormone IUD. This type of IUD contains the hormone progestin (synthetic progesterone). The hormone thickens the cervical mucus and prevents sperm from entering the uterus, and it also thins the uterine lining to prevent implantation of a fertilized egg. The hormone can weaken or kill the sperm that get into the uterus. It can stay in place for 5 years.  PERMANENT METHODS OF CONTRACEPTION  Female tubal ligation. This is when the woman's fallopian tubes are surgically sealed, tied, or blocked to prevent the egg from traveling to the uterus.   Female sterilization. This is when the female has the tubes that carry sperm tied off (vasectomy).This blocks sperm from entering the vagina during sexual intercourse. After the procedure, the man can still ejaculate fluid (semen).  NATURAL PLANNING METHODS  Natural family planning. This is not having sexual intercourse or using a barrier method (condom, diaphragm, cervical cap) on days the woman could become pregnant.   Calendar method. This is keeping track of the length of each menstrual cycle and identifying when you are fertile.   Ovulation method. This is avoiding sexual intercourse during ovulation.   Symptothermal method. This is avoiding sexual intercourse during ovulation, using a thermometer and ovulation symptoms.   Post-ovulation method. This is timing sexual intercourse after you have ovulated.  Regardless of which type or method of contraception you choose, it is important that you use condoms to protect against the transmission of sexually transmitted diseases (STDs). Talk with your caregiver about which form of contraception is most appropriate for you. Document Released: 06/03/2005 Document Revised: 05/23/2011 Document Reviewed: 10/10/2010 [employer] Patient Information 2012 [employer]. Breastfeeding BENEFITS OF BREASTFEEDING For  the baby  The first milk (colostrum) helps the baby's digestive system function better.   There are antibodies from the mother in the milk that help the baby fight off infections.   The baby has a lower incidence of asthma, allergies, and SIDS (sudden infant death syndrome).   The nutrients in breast milk are better than formulas for the baby and helps the baby's brain grow better.   Babies who breastfeed have less gas, colic, and constipation.  For the mother  Breastfeeding helps develop a very special bond between mother and baby.   It is more convenient, always available at the correct temperature and cheaper than formula feeding.   It burns calories in the mother and helps with losing weight that was gained during pregnancy.   It makes the uterus contract back down to normal size faster and slows bleeding following delivery.   Breastfeeding mothers have a lower risk of developing breast cancer.  NURSE FREQUENTLY  A healthy, full-term baby may breastfeed as often as every hour or space his or her feedings to every 3 hours.   How often to nurse will vary from baby to baby. Watch your baby for signs of hunger, not the clock.   Nurse as often as the baby requests, or when you feel the need to reduce the fullness of your breasts.   Awaken the baby if   it has been 3 to 4 hours since the last feeding.   Frequent feeding will help the mother make more milk and will prevent problems like sore nipples and engorgement of the breasts.  BABY'S POSITION AT THE BREAST  Whether lying down or sitting, be sure that the baby's tummy is facing your tummy.   Support the breast with 4 fingers underneath the breast and the thumb above. Make sure your fingers are well away from the nipple and baby's mouth.   Stroke the baby's lips and cheek closest to the breast gently with your finger or nipple.   When the baby's mouth is open wide enough, place all of your nipple and as much of the dark area  around the nipple as possible into your baby's mouth.   Pull the baby in close so the tip of the nose and the baby's cheeks touch the breast during the feeding.  FEEDINGS  The length of each feeding varies from baby to baby and from feeding to feeding.   The baby must suck about 2 to 3 minutes for your milk to get to him or her. This is called a "let down." For this reason, allow the baby to feed on each breast as long as he or she wants. Your baby will end the feeding when he or she has received the right balance of nutrients.   To break the suction, put your finger into the corner of the baby's mouth and slide it between his or her gums before removing your breast from his or her mouth. This will help prevent sore nipples.  REDUCING BREAST ENGORGEMENT  In the first week after your baby is born, you may experience signs of breast engorgement. When breasts are engorged, they feel heavy, warm, full, and may be tender to the touch. You can reduce engorgement if you:   Nurse frequently, every 2 to 3 hours. Mothers who breastfeed early and often have fewer problems with engorgement.   Place light ice packs on your breasts between feedings. This reduces swelling. Wrap the ice packs in a lightweight towel to protect your skin.   Apply moist hot packs to your breast for 5 to 10 minutes before each feeding. This increases circulation and helps the milk flow.   Gently massage your breast before and during the feeding.   Make sure that the baby empties at least one breast at every feeding before switching sides.   Use a breast pump to empty the breasts if your baby is sleepy or not nursing well. You may also want to pump if you are returning to work or or you feel you are getting engorged.   Avoid bottle feeds, pacifiers or supplemental feedings of water or juice in place of breastfeeding.   Be sure the baby is latched on and positioned properly while breastfeeding.   Prevent fatigue, stress, and  anemia.   Wear a supportive bra, avoiding underwire styles.   Eat a balanced diet with enough fluids.  If you follow these suggestions, your engorgement should improve in 24 to 48 hours. If you are still experiencing difficulty, call your lactation consultant or caregiver. IS MY BABY GETTING ENOUGH MILK? Sometimes, mothers worry about whether their babies are getting enough milk. You can be assured that your baby is getting enough milk if:  The baby is actively sucking and you hear swallowing.   The baby nurses at least 8 to 12 times in a 24 hour time period. Nurse your baby   until he or she unlatches or falls asleep at the first breast (at least 10 to 20 minutes), then offer the second side.   The baby is wetting 5 to 6 disposable diapers (6 to 8 cloth diapers) in a 24 hour period by 5 to 6 days of age.   The baby is having at least 2 to 3 stools every 24 hours for the first few months. Breast milk is all the food your baby needs. It is not necessary for your baby to have water or formula. In fact, to help your breasts make more milk, it is best not to give your baby supplemental feedings during the early weeks.   The stool should be soft and yellow.   The baby should gain 4 to 7 ounces per week after he is 4 days old.  TAKE CARE OF YOURSELF Take care of your breasts by:  Bathing or showering daily.   Avoiding the use of soaps on your nipples.   Start feedings on your left breast at one feeding and on your right breast at the next feeding.   You will notice an increase in your milk supply 2 to 5 days after delivery. You may feel some discomfort from engorgement, which makes your breasts very firm and often tender. Engorgement "peaks" out within 24 to 48 hours. In the meantime, apply warm moist towels to your breasts for 5 to 10 minutes before feeding. Gentle massage and expression of some milk before feeding will soften your breasts, making it easier for your baby to latch on. Wear a  well fitting nursing bra and air dry your nipples for 10 to 15 minutes after each feeding.   Only use cotton bra pads.   Only use pure lanolin on your nipples after nursing. You do not need to wash it off before nursing.  Take care of yourself by:   Eating well-balanced meals and nutritious snacks.   Drinking milk, fruit juice, and water to satisfy your thirst (about 8 glasses a day).   Getting plenty of rest.   Increasing calcium in your diet (1200 mg a day).   Avoiding foods that you notice affect the baby in a bad way.  SEEK MEDICAL CARE IF:   You have any questions or difficulty with breastfeeding.   You need help.   You have a hard, red, sore area on your breast, accompanied by a fever of 100.5 F (38.1 C) or more.   Your baby is too sleepy to eat well or is having trouble sleeping.   Your baby is wetting less than 6 diapers per day, by 5 days of age.   Your baby's skin or white part of his or her eyes is more yellow than it was in the hospital.   You feel depressed.  Document Released: 06/03/2005 Document Revised: 05/23/2011 Document Reviewed: 01/16/2009 [employer] Patient Information 2012 [employer]. Normal Labor and Delivery Your caregiver must first be sure you are in labor. Signs of labor include:  You may pass what is called "the mucus plug" before labor begins. This is a small amount of blood stained mucus.   Regular uterine contractions.   The time between contractions get closer together.   The discomfort and pain gradually gets more intense.   Pains are mostly located in the back.   Pains get worse when walking.   The cervix (the opening of the uterus becomes thinner (begins to efface) and opens up (dilates).  Once you are in labor   and admitted into the hospital or care center, your caregiver will do the following:  A complete physical examination.   Check your vital signs (blood pressure, pulse, temperature and the fetal heart rate).   Do a  vaginal examination (using a sterile glove and lubricant) to determine:   The position (presentation) of the baby (head [vertex] or buttock first).   The level (station) of the baby's head in the birth canal.   The effacement and dilatation of the cervix.   You may have your pubic hair shaved and be given an enema depending on your caregiver and the circumstance.   An electronic monitor is usually placed on your abdomen. The monitor follows the length and intensity of the contractions, as well as the baby's heart rate.   Usually, your caregiver will insert an IV in your arm with a bottle of sugar water. This is done as a precaution so that medications can be given to you quickly during labor or delivery.  NORMAL LABOR AND DELIVERY IS DIVIDED UP INTO 3 STAGES: First Stage This is when regular contractions begin and the cervix begins to efface and dilate. This stage can last from 3 to 15 hours. The end of the first stage is when the cervix is 100% effaced and 10 centimeters dilated. Pain medications may be given by   Injection (morphine, demerol, etc.)   Regional anesthesia (spinal, caudal or epidural, anesthetics given in different locations of the spine). Paracervical pain medication may be given, which is an injection of and anesthetic on each side of the cervix.  A pregnant woman may request to have "Natural Childbirth" which is not to have any medications or anesthesia during her labor and delivery. Second Stage This is when the baby comes down through the birth canal (vagina) and is born. This can take 1 to 4 hours. As the baby's head comes down through the birth canal, you may feel like you are going to have a bowel movement. You will get the urge to bear down and push until the baby is delivered. As the baby's head is being delivered, the caregiver will decide if an episiotomy (a cut in the perineum and vagina area) is needed to prevent tearing of the tissue in this area. The episiotomy  is sewn up after the delivery of the baby and placenta. Sometimes a mask with nitrous oxide is given for the mother to breath during the delivery of the baby to help if there is too much pain. The end of Stage 2 is when the baby is fully delivered. Then when the umbilical cord stops pulsating it is clamped and cut. Third Stage The third stage begins after the baby is completely delivered and ends after the placenta (afterbirth) is delivered. This usually takes 5 to 30 minutes. After the placenta is delivered, a medication is given either by intravenous or injection to help contract the uterus and prevent bleeding. The third stage is not painful and pain medication is usually not necessary. If an episiotomy was done, it is repaired at this time. After the delivery, the mother is watched and monitored closely for 1 to 2 hours to make sure there is no postpartum bleeding (hemorrhage). If there is a lot of bleeding, medication is given to contract the uterus and stop the bleeding. Document Released: 03/12/2008 Document Revised: 05/23/2011 Document Reviewed: 03/12/2008 [employer] Patient Information 2012 [employer]. 

## 2011-11-28 NOTE — Progress Notes (Signed)
11/21/11 NST reactive 

## 2011-11-29 DIAGNOSIS — O0990 Supervision of high risk pregnancy, unspecified, unspecified trimester: Secondary | ICD-10-CM

## 2011-11-29 LAB — GC/CHLAMYDIA PROBE AMP, GENITAL: GC Probe Amp, Genital: NEGATIVE

## 2011-11-29 NOTE — Progress Notes (Incomplete)
6/10 NST reviewed and reactive 

## 2011-12-02 ENCOUNTER — Ambulatory Visit (INDEPENDENT_AMBULATORY_CARE_PROVIDER_SITE_OTHER): Payer: Medicaid Other | Admitting: *Deleted

## 2011-12-02 VITALS — BP 134/66 | Wt 215.7 lb

## 2011-12-02 DIAGNOSIS — O139 Gestational [pregnancy-induced] hypertension without significant proteinuria, unspecified trimester: Secondary | ICD-10-CM

## 2011-12-02 NOTE — Progress Notes (Signed)
P-100 

## 2011-12-05 ENCOUNTER — Ambulatory Visit (INDEPENDENT_AMBULATORY_CARE_PROVIDER_SITE_OTHER): Payer: Medicaid Other | Admitting: Obstetrics and Gynecology

## 2011-12-05 ENCOUNTER — Ambulatory Visit (HOSPITAL_COMMUNITY)
Admission: RE | Admit: 2011-12-05 | Discharge: 2011-12-05 | Disposition: A | Discharge status: Home / Self Care | Payer: Medicaid Other | Source: Ambulatory Visit | Attending: Family Medicine | Admitting: Family Medicine

## 2011-12-05 VITALS — BP 117/74 | Temp 97.6°F | Wt 214.6 lb

## 2011-12-05 DIAGNOSIS — O169 Unspecified maternal hypertension, unspecified trimester: Secondary | ICD-10-CM

## 2011-12-05 DIAGNOSIS — O09529 Supervision of elderly multigravida, unspecified trimester: Secondary | ICD-10-CM

## 2011-12-05 DIAGNOSIS — O0990 Supervision of high risk pregnancy, unspecified, unspecified trimester: Secondary | ICD-10-CM

## 2011-12-05 DIAGNOSIS — D649 Anemia, unspecified: Secondary | ICD-10-CM

## 2011-12-05 DIAGNOSIS — O10019 Pre-existing essential hypertension complicating pregnancy, unspecified trimester: Secondary | ICD-10-CM | POA: Insufficient documentation

## 2011-12-05 DIAGNOSIS — O139 Gestational [pregnancy-induced] hypertension without significant proteinuria, unspecified trimester: Secondary | ICD-10-CM

## 2011-12-05 LAB — POCT URINALYSIS DIP (DEVICE)
Bilirubin Urine: NEGATIVE
Ketones, ur: NEGATIVE mg/dL
Protein, ur: NEGATIVE mg/dL

## 2011-12-05 NOTE — Progress Notes (Signed)
NST reviewed and reactive. Patient doing well without complaints. FM/labor precautions reviewed.

## 2011-12-05 NOTE — Progress Notes (Signed)
Pulse: 78

## 2011-12-09 ENCOUNTER — Ambulatory Visit (INDEPENDENT_AMBULATORY_CARE_PROVIDER_SITE_OTHER): Payer: Medicaid Other | Admitting: *Deleted

## 2011-12-09 VITALS — BP 131/80 | Temp 98.5°F | Wt 218.0 lb

## 2011-12-09 DIAGNOSIS — O139 Gestational [pregnancy-induced] hypertension without significant proteinuria, unspecified trimester: Secondary | ICD-10-CM

## 2011-12-09 NOTE — Progress Notes (Signed)
P=102 , here for NST

## 2011-12-11 NOTE — Progress Notes (Incomplete)
6/24 NST reviewed and reactive 

## 2011-12-12 ENCOUNTER — Encounter (HOSPITAL_COMMUNITY): Payer: Self-pay | Admitting: *Deleted

## 2011-12-12 ENCOUNTER — Ambulatory Visit (HOSPITAL_COMMUNITY)
Admission: RE | Admit: 2011-12-12 | Discharge: 2011-12-12 | Disposition: A | Discharge status: Home / Self Care | Payer: Medicaid Other | Source: Ambulatory Visit | Attending: Family Medicine | Admitting: Family Medicine

## 2011-12-12 ENCOUNTER — Telehealth (HOSPITAL_COMMUNITY): Payer: Self-pay | Admitting: *Deleted

## 2011-12-12 ENCOUNTER — Ambulatory Visit (INDEPENDENT_AMBULATORY_CARE_PROVIDER_SITE_OTHER): Payer: Medicaid Other | Admitting: Obstetrics and Gynecology

## 2011-12-12 VITALS — BP 135/90 | Temp 98.2°F | Wt 217.6 lb

## 2011-12-12 DIAGNOSIS — O139 Gestational [pregnancy-induced] hypertension without significant proteinuria, unspecified trimester: Secondary | ICD-10-CM

## 2011-12-12 DIAGNOSIS — O10019 Pre-existing essential hypertension complicating pregnancy, unspecified trimester: Secondary | ICD-10-CM | POA: Insufficient documentation

## 2011-12-12 DIAGNOSIS — O169 Unspecified maternal hypertension, unspecified trimester: Secondary | ICD-10-CM

## 2011-12-12 DIAGNOSIS — O0930 Supervision of pregnancy with insufficient antenatal care, unspecified trimester: Secondary | ICD-10-CM | POA: Insufficient documentation

## 2011-12-12 DIAGNOSIS — K219 Gastro-esophageal reflux disease without esophagitis: Secondary | ICD-10-CM

## 2011-12-12 DIAGNOSIS — O09529 Supervision of elderly multigravida, unspecified trimester: Secondary | ICD-10-CM | POA: Insufficient documentation

## 2011-12-12 DIAGNOSIS — O0990 Supervision of high risk pregnancy, unspecified, unspecified trimester: Secondary | ICD-10-CM

## 2011-12-12 LAB — POCT URINALYSIS DIP (DEVICE)
Nitrite: NEGATIVE
Protein, ur: NEGATIVE mg/dL
Urobilinogen, UA: 0.2 mg/dL (ref 0.0–1.0)

## 2011-12-12 NOTE — Progress Notes (Signed)
Scheduled for IOL for 12/23/11 as requested by Dr. Jolayne Panther

## 2011-12-12 NOTE — Progress Notes (Signed)
NST reviewed and reactive. Patient is doing well without complaints. Reports occ contractions. Normal ultrasound this am with normal fluid. Will schedule IOL 7/8. FM/labor precautions and si/sx of pre-eclampsia reviewed.

## 2011-12-12 NOTE — Progress Notes (Signed)
Pulse: 90

## 2011-12-12 NOTE — Telephone Encounter (Signed)
Preadmission screen  

## 2011-12-16 ENCOUNTER — Other Ambulatory Visit: Payer: Medicaid Other

## 2011-12-18 ENCOUNTER — Ambulatory Visit (HOSPITAL_COMMUNITY)
Admission: RE | Admit: 2011-12-18 | Discharge: 2011-12-18 | Disposition: A | Discharge status: Home / Self Care | Payer: Medicaid Other | Source: Ambulatory Visit | Attending: Obstetrics and Gynecology | Admitting: Obstetrics and Gynecology

## 2011-12-18 ENCOUNTER — Ambulatory Visit (HOSPITAL_COMMUNITY)
Admission: RE | Admit: 2011-12-18 | Discharge: 2011-12-18 | Disposition: A | Discharge status: Home / Self Care | Payer: Medicaid Other | Source: Ambulatory Visit | Attending: Obstetrics & Gynecology | Admitting: Obstetrics & Gynecology

## 2011-12-18 ENCOUNTER — Ambulatory Visit (INDEPENDENT_AMBULATORY_CARE_PROVIDER_SITE_OTHER): Payer: Medicaid Other | Admitting: *Deleted

## 2011-12-18 VITALS — BP 134/82 | Wt 217.6 lb

## 2011-12-18 DIAGNOSIS — O0930 Supervision of pregnancy with insufficient antenatal care, unspecified trimester: Secondary | ICD-10-CM | POA: Insufficient documentation

## 2011-12-18 DIAGNOSIS — O09529 Supervision of elderly multigravida, unspecified trimester: Secondary | ICD-10-CM | POA: Insufficient documentation

## 2011-12-18 DIAGNOSIS — E669 Obesity, unspecified: Secondary | ICD-10-CM | POA: Insufficient documentation

## 2011-12-18 DIAGNOSIS — O139 Gestational [pregnancy-induced] hypertension without significant proteinuria, unspecified trimester: Secondary | ICD-10-CM

## 2011-12-18 DIAGNOSIS — O10019 Pre-existing essential hypertension complicating pregnancy, unspecified trimester: Secondary | ICD-10-CM | POA: Insufficient documentation

## 2011-12-18 MED ORDER — PANTOPRAZOLE SODIUM 40 MG PO TBEC: 40.0000 mg | DELAYED_RELEASE_TABLET | Freq: Every day | ORAL | Status: AC

## 2011-12-18 NOTE — Patient Instructions (Signed)
Fetal Monitoring, Biophysical Profile This test measures and evaluates 5 observations of the baby. These include the nonstress test, the baby's breathing, movements, muscle tone and the amount of amniotic fluid. The reason to monitor your baby (fetus) before birth is to identify and correct problems that may prevent serious problems from developing with the fetus, including fetal loss. Some pregnancies are complicated by the mother's medical problems. Some of these problems are type 1 diabetes mellitus, high blood pressure and other chronic medical illnesses. This is why it is important to monitor the baby before birth.  OTHER TECHNIQUES FOR MONITORING YOUR BABY BEFORE BIRTH:  Several fetal observation tests are in use. These include:   Fetal movement assessment (FMA). This is done by the pregnant woman herself by counting and recording the baby's movements over a certain time period.   Nonstress test (NST). This test monitors the baby's heart rate when the baby moves.   Contraction stress test (CST). This test monitors the baby's heart rate during a contraction of the uterus.   Modified BPP. This measures the volume of fluid in different parts of the amniotic sac (amniotic fluid index) and the results of the nonstress test.   Umbilical artery doppler velocimetry. This evaluates the blood flow through the umbilical cord.  There are several very serious problems that cannot be predicted or detected with any of the fetal monitoring procedures. These problems include separation (abruption) of the placenta or when the fetus chokes on the umbilical cord (umbilical cord accident). Your caregiver will help you understand the tests and what they mean for you and your baby. It is your responsibility to obtain your test results. LET YOUR CAREGIVER KNOW ABOUT:   Any medications you are taking including prescription and over-the-counter drugs, herbs, eye drops and creams.   If you have a fever.   If you  have an infection.   If you are sick.  RISKS AND COMPLICATIONS  There are no risks or complications to the mother or the fetus. BEFORE THE PROCEDURE   Do not take medications that may increase or decrease the baby's heart rate and/or movements.   Eat a full meal at least 2 hours before the test.   Do not smoke if you are pregnant. If you smoke, stop at least 2 days before the test. It is best not to smoke at all when you are pregnant.  PROCEDURE The BPP contains five parts:  Nonstress test.   Fetal breathing movements.   Fetal movement.   Fetal tone (extension and flexion of the extremities).   Measuring of the amniotic fluid volume.  Each of the five test parts is assigned a score of either 2 (normal) or 0 (abnormal). A combined score of 8 or 10 is normal, a score of 6 could go either way, and a score of 4 or less is abnormal. No matter what the final score of the BPP is, if the amniotic fluid volume is 2 centimeters or less, further studies and evaluation of the baby should take place. AFTER THE PROCEDURE  You may go home and resume your usual activities, or as directed by your caregiver. HOME CARE INSTRUCTIONS   Follow your caregiver's advice and recommendations.   Be aware of your baby's movements. Are they normal, less than usual or more than usual?   Make and keep the rest of your prenatal appointments.  SEEK MEDICAL CARE IF:   You develop a temperature of 100 F (37.8 C) or higher.     You have a bloody mucus discharge (bloody show).  SEEK IMMEDIATE MEDICAL CARE IF:  You do not feel the baby moving.   You think the baby's movements have been less than usual or too many.   You develop uterine contractions.   You develop vaginal bleeding.   You develop abdominal pain.   You have leaking or a gush of fluid from the vagina.  Document Released: 05/24/2002 Document Revised: 05/23/2011 Document Reviewed: 03/28/2008 [employer] Patient Information 2012 [employer]. 

## 2011-12-18 NOTE — Progress Notes (Signed)
P = 82  Pt states baby was moving very much last night and she has not eaten anything yet today due to reflux and vomiting.  Rx ordered per Dr. Debroah Loop.  Pt to eat and then have repeat BPP today @ 1530 due to appt not available for tomorrow.

## 2011-12-18 NOTE — Progress Notes (Signed)
BPP today 4/8, with NST reactive and no decelerations. Patient reports good fetal movement. Repeat BPP 7/4. Scheduled for induction 7/8

## 2011-12-19 ENCOUNTER — Encounter (HOSPITAL_COMMUNITY): Payer: Self-pay | Admitting: *Deleted

## 2011-12-19 ENCOUNTER — Inpatient Hospital Stay (HOSPITAL_COMMUNITY)
Admission: AD | Admit: 2011-12-19 | Discharge: 2011-12-20 | DRG: 774 | Disposition: A | Discharge status: Home / Self Care | Payer: Medicaid Other | Source: Ambulatory Visit | Attending: Obstetrics and Gynecology | Admitting: Obstetrics and Gynecology

## 2011-12-19 DIAGNOSIS — D649 Anemia, unspecified: Secondary | ICD-10-CM | POA: Diagnosis not present

## 2011-12-19 DIAGNOSIS — O1002 Pre-existing essential hypertension complicating childbirth: Secondary | ICD-10-CM | POA: Diagnosis present

## 2011-12-19 DIAGNOSIS — O09529 Supervision of elderly multigravida, unspecified trimester: Secondary | ICD-10-CM

## 2011-12-19 DIAGNOSIS — O0990 Supervision of high risk pregnancy, unspecified, unspecified trimester: Secondary | ICD-10-CM

## 2011-12-19 DIAGNOSIS — O9903 Anemia complicating the puerperium: Principal | ICD-10-CM | POA: Diagnosis not present

## 2011-12-19 LAB — RPR: RPR Ser Ql: NONREACTIVE

## 2011-12-19 LAB — CBC
Hemoglobin: 8.8 g/dL — ABNORMAL LOW (ref 12.0–15.0)
MCH: 22.7 pg — ABNORMAL LOW (ref 26.0–34.0)
MCHC: 31.4 g/dL (ref 30.0–36.0)

## 2011-12-19 LAB — ABO/RH: ABO/RH(D): O POS

## 2011-12-19 MED ORDER — IBUPROFEN 600 MG PO TABS: 600.0000 mg | ORAL_TABLET | Freq: Four times a day (QID) | ORAL | Status: DC | PRN

## 2011-12-19 MED ORDER — SENNOSIDES-DOCUSATE SODIUM 8.6-50 MG PO TABS
2.0000 | ORAL_TABLET | Freq: Every day | ORAL | Status: DC
  Administered 2011-12-19: 2 via ORAL

## 2011-12-19 MED ORDER — OXYTOCIN 40 UNITS IN LACTATED RINGERS INFUSION - SIMPLE MED
62.5000 mL/h | Freq: Once | INTRAVENOUS | Status: DC
  Filled 2011-12-19: qty 1000

## 2011-12-19 MED ORDER — CITRIC ACID-SODIUM CITRATE 334-500 MG/5ML PO SOLN: 30.0000 mL | ORAL | Status: DC | PRN

## 2011-12-19 MED ORDER — OXYCODONE-ACETAMINOPHEN 5-325 MG PO TABS: 1.0000 | ORAL_TABLET | ORAL | Status: DC | PRN

## 2011-12-19 MED ORDER — ONDANSETRON HCL 4 MG/2ML IJ SOLN: 4.0000 mg | Freq: Four times a day (QID) | INTRAMUSCULAR | Status: DC | PRN

## 2011-12-19 MED ORDER — DIBUCAINE 1 % RE OINT: 1.0000 "application " | TOPICAL_OINTMENT | RECTAL | Status: DC | PRN

## 2011-12-19 MED ORDER — BENZOCAINE-MENTHOL 20-0.5 % EX AERO: 1.0000 "application " | INHALATION_SPRAY | CUTANEOUS | Status: DC | PRN

## 2011-12-19 MED ORDER — TETANUS-DIPHTH-ACELL PERTUSSIS 5-2.5-18.5 LF-MCG/0.5 IM SUSP
0.5000 mL | Freq: Once | INTRAMUSCULAR | Status: AC
  Administered 2011-12-19: 0.5 mL via INTRAMUSCULAR
  Filled 2011-12-19: qty 0.5

## 2011-12-19 MED ORDER — LACTATED RINGERS IV SOLN: 500.0000 mL | INTRAVENOUS | Status: DC | PRN

## 2011-12-19 MED ORDER — DIPHENHYDRAMINE HCL 25 MG PO CAPS: 25.0000 mg | ORAL_CAPSULE | Freq: Four times a day (QID) | ORAL | Status: DC | PRN

## 2011-12-19 MED ORDER — ONDANSETRON HCL 4 MG PO TABS: 4.0000 mg | ORAL_TABLET | ORAL | Status: DC | PRN

## 2011-12-19 MED ORDER — SIMETHICONE 80 MG PO CHEW: 80.0000 mg | CHEWABLE_TABLET | ORAL | Status: DC | PRN

## 2011-12-19 MED ORDER — ACETAMINOPHEN 325 MG PO TABS: 650.0000 mg | ORAL_TABLET | ORAL | Status: DC | PRN

## 2011-12-19 MED ORDER — LIDOCAINE HCL (PF) 1 % IJ SOLN
30.0000 mL | INTRAMUSCULAR | Status: DC | PRN
  Filled 2011-12-19: qty 30

## 2011-12-19 MED ORDER — NALBUPHINE SYRINGE 5 MG/0.5 ML: 5.0000 mg | INJECTION | INTRAMUSCULAR | Status: DC | PRN

## 2011-12-19 MED ORDER — ZOLPIDEM TARTRATE 5 MG PO TABS: 5.0000 mg | ORAL_TABLET | Freq: Every evening | ORAL | Status: DC | PRN

## 2011-12-19 MED ORDER — IBUPROFEN 600 MG PO TABS
600.0000 mg | ORAL_TABLET | Freq: Four times a day (QID) | ORAL | Status: DC
  Administered 2011-12-19 – 2011-12-20 (×5): 600 mg via ORAL
  Filled 2011-12-19 (×5): qty 1

## 2011-12-19 MED ORDER — PRENATAL MULTIVITAMIN CH
1.0000 | ORAL_TABLET | Freq: Every day | ORAL | Status: DC
  Administered 2011-12-19 – 2011-12-20 (×2): 1 via ORAL
  Filled 2011-12-19 (×2): qty 1

## 2011-12-19 MED ORDER — LACTATED RINGERS IV SOLN
INTRAVENOUS | Status: DC
  Administered 2011-12-19: 125 mL/h via INTRAVENOUS

## 2011-12-19 MED ORDER — LANOLIN HYDROUS EX OINT: TOPICAL_OINTMENT | CUTANEOUS | Status: DC | PRN

## 2011-12-19 MED ORDER — FLEET ENEMA 7-19 GM/118ML RE ENEM: 1.0000 | ENEMA | RECTAL | Status: DC | PRN

## 2011-12-19 MED ORDER — ONDANSETRON HCL 4 MG/2ML IJ SOLN: 4.0000 mg | INTRAMUSCULAR | Status: DC | PRN

## 2011-12-19 MED ORDER — OXYTOCIN BOLUS FROM INFUSION
250.0000 mL | Freq: Once | INTRAVENOUS | Status: DC
  Filled 2011-12-19: qty 500

## 2011-12-19 MED ORDER — WITCH HAZEL-GLYCERIN EX PADS: 1.0000 "application " | MEDICATED_PAD | CUTANEOUS | Status: DC | PRN

## 2011-12-19 NOTE — Progress Notes (Signed)
Seen also by me.  Agree with note. 

## 2011-12-19 NOTE — MAU Note (Signed)
Brought straight back to room with c/o hurting pretty bad with labor pains.  Started @ 0100 this morning. Denies leaking or leaking just contractions.

## 2011-12-19 NOTE — Progress Notes (Signed)
S: This is a 36 year old G5P4004 at [redacted]w[redacted]d by LMP presenting with SOL approx. 0100.  Complications this pregnancy: HTN  Contractions: every 6 minutes Membranes: intact Vaginal bleeding: none Vaginal discharge: none Fetal movement: none x 24 hours  O: Filed Vitals:   12/19/11 0305  BP: 176/103  Pulse: 96  Temp: 97.9 F (36.6 C)  Resp: 20    Review of Systems: General: negative for - chills, fever or night sweats Psychological ROS: negative Ophthalmic ROS: negative for - blurry vision or double vision ENT ROS: negative Allergy and Immunology ROS: negative Hematological and Lymphatic ROS: negative for - bleeding problems or night sweats Endocrine ROS: negative Respiratory ROS: no cough, shortness of breath, or wheezing Cardiovascular ROS: no chest pain or dyspnea on exertion Gastrointestinal ROS: no abdominal pain, change in bowel habits, or black or bloody stools Genito-Urinary ROS: no dysuria, trouble voiding, or hematuria Musculoskeletal ROS: negative Neurological ROS: no TIA or stroke symptoms Dermatological ROS: negative   Physical Examination:  General appearance - oriented to person, place, and time and laboring Mental status - normal mood, behavior, speech, dress, motor activity, and thought processes Eyes - pupils equal and reactive, extraocular eye movements intact Mouth - mucous membranes moist, pharynx normal without lesions Chest - clear to auscultation, no wheezes, rales or rhonchi, symmetric air entry Heart - normal rate, regular rhythm, normal S1, S2, no murmurs, rubs, clicks or gallops Abdomen - gravid, size c/w dates, bowel sounds normal, no CVA tenderness, no hernias noted Back exam - full range of motion, no tenderness, palpable spasm or pain on motion Neurological - alert, oriented, normal speech, no focal findings or movement disorder noted Musculoskeletal - no joint tenderness, deformity or swelling Extremities - pedal edema 2 + B/L LE, intact  peripheral pulses Skin - normal coloration and turgor, no rashes, no suspicious skin lesions noted   Dilation: 7 Effacement (%): 100 Cervical Position: Middle Station: -2 Presentation: Vertex Exam by:: Elie Confer RN Spec exam: not performed FHT: 135, mod variability, + accels, no decels Ctx: q8min  Prenatal labs: ABO, Rh:  O positive Antibody:  Neg Rubella:  Imm RPR:   NR HBsAg: Neg  HIV:   Neg GBS:  Negative Hgb/Plt:  8.1 / 232 1hr GTT: 90 (normal) 1st trimester screen: Quad negative  Labs:  No results found for this or any previous visit (from the past 24 hour(s)).    A/P: 36 year old G5P4004 @ [redacted]w[redacted]d presents with SOL 1. Admit to L&D 2. Expectant management, anticipate vaginal delivery 3. Antibiotics, none 4. Continuous toco/FHT

## 2011-12-19 NOTE — Progress Notes (Signed)
UR chart review completed.  

## 2011-12-20 MED ORDER — FERROUS SULFATE 325 (65 FE) MG PO TABS: 325.0000 mg | ORAL_TABLET | Freq: Three times a day (TID) | ORAL | Status: AC

## 2011-12-20 MED ORDER — IBUPROFEN 600 MG PO TABS: 600.0000 mg | ORAL_TABLET | Freq: Four times a day (QID) | ORAL | Status: AC | PRN

## 2011-12-20 MED ORDER — NORETHINDRONE 0.35 MG PO TABS: 1.0000 | ORAL_TABLET | Freq: Every day | ORAL | Status: DC

## 2011-12-20 MED ORDER — DOCUSATE SODIUM 100 MG PO CAPS: 100.0000 mg | ORAL_CAPSULE | Freq: Two times a day (BID) | ORAL | Status: AC

## 2011-12-20 NOTE — Discharge Summary (Signed)
Pt seen and examined Agree with above note.  Bona Hubbard H. 12/20/2011 8:00 AM

## 2011-12-20 NOTE — Discharge Summary (Addendum)
Obstetric Discharge Summary Reason for Admission: onset of labor Prenatal Procedures: ultrasound Intrapartum Procedures: spontaneous vaginal delivery Postpartum Procedures: none Complications-Operative and Postpartum: none Hemoglobin  Date Value Range Status  12/19/2011 8.8* 12.0 - 15.0 g/dL Final     HCT  Date Value Range Status  12/19/2011 28.0* 36.0 - 46.0 % Final    Physical Exam:  General: alert and cooperative Lochia: appropriate Uterine Fundus: firm Incision: none DVT Evaluation: No evidence of DVT seen on physical exam. Negative Homan's sign. No cords or calf tenderness. No significant calf/ankle edema.  Discharge Diagnoses: Term Pregnancy-delivered  Discharge Information: Date: 12/20/2011 Activity: unrestricted Diet: routine Medications: None Condition: stable Instructions: refer to practice specific booklet Discharge to: home   Newborn Data: Live born female  Birth Weight: 7 lb 10 oz (3459 g) APGAR: 9, 9  Home with mother.  Marikay Alar 12/20/2011, 7:51 AM  Needs iron for anemia.  Pt asymptomatic.

## 2011-12-21 NOTE — H&P (Signed)
This is a 36 year old G5P4004 at [redacted]w[redacted]d by LMP presenting with SOL approx. 0100.  Complications this pregnancy: HTN  Contractions: every 6 minutes  Membranes: intact  Vaginal bleeding: none  Vaginal discharge: none  Fetal movement: none x 24 hours  O:  Filed Vitals:    12/19/11 0305   BP:  176/103   Pulse:  96   Temp:  97.9 F (36.6 C)   Resp:  20    Review of Systems:  General: negative for - chills, fever or night sweats  Psychological ROS: negative  Ophthalmic ROS: negative for - blurry vision or double vision  ENT ROS: negative  Allergy and Immunology ROS: negative  Hematological and Lymphatic ROS: negative for - bleeding problems or night sweats  Endocrine ROS: negative  Respiratory ROS: no cough, shortness of breath, or wheezing  Cardiovascular ROS: no chest pain or dyspnea on exertion  Gastrointestinal ROS: no abdominal pain, change in bowel habits, or black or bloody stools  Genito-Urinary ROS: no dysuria, trouble voiding, or hematuria  Musculoskeletal ROS: negative  Neurological ROS: no TIA or stroke symptoms  Dermatological ROS: negative  Physical Examination:  General appearance - oriented to person, place, and time and laboring  Mental status - normal mood, behavior, speech, dress, motor activity, and thought processes  Eyes - pupils equal and reactive, extraocular eye movements intact  Mouth - mucous membranes moist, pharynx normal without lesions  Chest - clear to auscultation, no wheezes, rales or rhonchi, symmetric air entry  Heart - normal rate, regular rhythm, normal S1, S2, no murmurs, rubs, clicks or gallops  Abdomen - gravid, size c/w dates, bowel sounds normal, no CVA tenderness, no hernias noted  Back exam - full range of motion, no tenderness, palpable spasm or pain on motion  Neurological - alert, oriented, normal speech, no focal findings or movement disorder noted  Musculoskeletal - no joint tenderness, deformity or swelling  Extremities - pedal  edema 2 + B/L LE, intact peripheral pulses  Skin - normal coloration and turgor, no rashes, no suspicious skin lesions noted  Dilation: 7  Effacement (%): 100  Cervical Position: Middle  Station: -2  Presentation: Vertex  Exam by:: Elie Confer RN  Spec exam: not performed  FHT: 135, mod variability, + accels, no decels  Ctx: q50min  Prenatal labs:  ABO, Rh: O positive  Antibody: Neg  Rubella: Imm  RPR: NR  HBsAg: Neg  HIV: Neg  GBS: Negative  Hgb/Plt: 8.1 / 232  1hr GTT: 90 (normal)  1st trimester screen: Quad negative  Labs:  No results found for this or any previous visit (from the past 24 hour(s)).  A/P: 36 year old G5P4004 @ [redacted]w[redacted]d presents with SOL  1. Admit to L&D  2. Expectant management, anticipate vaginal delivery  3. Antibiotics, none  4. Continuous toco/FHT

## 2011-12-23 ENCOUNTER — Inpatient Hospital Stay (HOSPITAL_COMMUNITY): Admission: RE | Admit: 2011-12-23 | Payer: Medicaid Other | Source: Ambulatory Visit

## 2012-01-01 ENCOUNTER — Inpatient Hospital Stay (HOSPITAL_COMMUNITY)
Admission: AD | Admit: 2012-01-01 | Discharge: 2012-01-01 | Disposition: A | Discharge status: Home / Self Care | Payer: Medicaid Other | Source: Ambulatory Visit | Attending: Family Medicine | Admitting: Family Medicine

## 2012-01-01 ENCOUNTER — Ambulatory Visit (INDEPENDENT_AMBULATORY_CARE_PROVIDER_SITE_OTHER): Payer: Medicaid Other | Admitting: Medical

## 2012-01-01 ENCOUNTER — Encounter (HOSPITAL_COMMUNITY): Payer: Self-pay | Admitting: *Deleted

## 2012-01-01 VITALS — BP 176/110

## 2012-01-01 DIAGNOSIS — O169 Unspecified maternal hypertension, unspecified trimester: Secondary | ICD-10-CM

## 2012-01-01 DIAGNOSIS — O0990 Supervision of high risk pregnancy, unspecified, unspecified trimester: Secondary | ICD-10-CM

## 2012-01-01 DIAGNOSIS — I10 Essential (primary) hypertension: Secondary | ICD-10-CM

## 2012-01-01 DIAGNOSIS — O135 Gestational [pregnancy-induced] hypertension without significant proteinuria, complicating the puerperium: Secondary | ICD-10-CM

## 2012-01-01 DIAGNOSIS — O165 Unspecified maternal hypertension, complicating the puerperium: Secondary | ICD-10-CM

## 2012-01-01 LAB — URINALYSIS, ROUTINE W REFLEX MICROSCOPIC
Bilirubin Urine: NEGATIVE
Glucose, UA: NEGATIVE mg/dL
Ketones, ur: NEGATIVE mg/dL
Nitrite: NEGATIVE
Specific Gravity, Urine: 1.01 (ref 1.005–1.030)
pH: 6 (ref 5.0–8.0)

## 2012-01-01 LAB — COMPREHENSIVE METABOLIC PANEL
ALT: 16 U/L (ref 0–35)
AST: 16 U/L (ref 0–37)
Albumin: 3.5 g/dL (ref 3.5–5.2)
Alkaline Phosphatase: 100 U/L (ref 39–117)
CO2: 27 mEq/L (ref 19–32)
Calcium: 9.6 mg/dL (ref 8.4–10.5)
Chloride: 104 mEq/L (ref 96–112)
GFR calc Af Amer: >90 mL/min (ref >90)
Glucose, Bld: 82 mg/dL (ref 70–99)
Total Protein: 7.2 g/dL (ref 6.0–8.3)

## 2012-01-01 LAB — CBC
HCT: 33.7 % — ABNORMAL LOW (ref 36.0–46.0)
Hemoglobin: 10.3 g/dL — ABNORMAL LOW (ref 12.0–15.0)
MCH: 22.4 pg — ABNORMAL LOW (ref 26.0–34.0)
RBC: 4.59 MIL/uL (ref 3.87–5.11)

## 2012-01-01 LAB — URINE MICROSCOPIC-ADD ON

## 2012-01-01 MED ORDER — HYDROCHLOROTHIAZIDE 25 MG PO TABS: 25.0000 mg | ORAL_TABLET | Freq: Every day | ORAL | Status: AC

## 2012-01-01 NOTE — Progress Notes (Signed)
Patient ID: Andrea Lucas, female   DOB: Aug 26, 1975, 36 y.o.   MRN: [id number]  Patient present for BP check pp. Delivered on 12/19/11. Initial BP reading was 175/110 and recheck was 168/108. Patient denies HA, vision changes, blurred vision or malaise. Discussed the patient with Gus Height, CNM and determined that patient will be sent to MAU for further evaluation and possible medical management. Patient was instructed to go straight to MAU and check-in for evaluation today after leaving the clinic. Her 6 week PP visit was confirmed with the front desk and patient will return on 01/16/12 at 4pm for that visit unless instructed to follow-up soon after evaluation in MAU today. Patient voiced understanding before leaving clinic today.   Margit Banda

## 2012-01-01 NOTE — MAU Note (Signed)
Patient states she had a SVD on 7-4. Home health RN checked BP today and was elevated and sent to the [facility]. BP again elevated and sent to MAU for evaluation.

## 2012-01-01 NOTE — MAU Provider Note (Signed)
History     CSN: [id number]  Arrival date and time: 01/01/12 1049   None     Chief Complaint  Patient presents with  . Hypertension   HPI Patient about 12 days postpartum from vaginal delivery.  Patient has history of gestational hypertension.  She was sent up from clinic for concerns of hypertension.  She denies HA, vision changes, abdominal pain, peripheral edema.  OB History    Grav Para Term Preterm Abortions TAB SAB Ect Mult Living   5 5 5       5       Past Medical History  Diagnosis Date  . Hypertension     No past surgical history on file.  Family History  Problem Relation Age of Onset  . Heart disease Father   . Hypertension Father   . Kidney disease Father     History  Substance Use Topics  . Smoking status: Never Smoker   . Smokeless tobacco: Never Used  . Alcohol Use: No    Allergies: No Known Allergies  Prescriptions prior to admission  Medication Sig Dispense Refill  . ferrous sulfate (FERROUSUL) 325 (65 FE) MG tablet Take 1 tablet (325 mg total) by mouth 3 (three) times daily with meals.  90 tablet  1  . ibuprofen (ADVIL,MOTRIN) 600 MG tablet Take 600 mg by mouth every 6 (six) hours as needed. For pain      . pantoprazole (PROTONIX) 40 MG tablet Take 1 tablet (40 mg total) by mouth daily.  20 tablet  1  . Prenatal Vit-Fe Fumarate-FA (PRENATAL MULTIVITAMIN) TABS Take 1 tablet by mouth daily.        ROS Physical Exam   Blood pressure 150/90, pulse 76, temperature 98.7 F (37.1 C), temperature source Oral, resp. rate 16, height 5\' 5"  (1.651 m), weight 92.171 kg (203 lb 3.2 oz), last menstrual period 03/18/2011, SpO2 100.00%, unknown if currently breastfeeding.  Physical Exam  Constitutional: She is oriented to person, place, and time. She appears well-developed and well-nourished.  Respiratory: Effort normal.  GI: Soft. She exhibits no distension and no mass. There is no tenderness. There is no rebound and no guarding.  Neurological: She is  alert and oriented to person, place, and time.  Skin: Skin is warm and dry.  Psychiatric: She has a normal mood and affect. Her behavior is normal. Judgment and thought content normal.   Results for orders placed during the hospital encounter of 01/01/12 (from the past 24 hour(s))  COMPREHENSIVE METABOLIC PANEL     Status: Abnormal   Collection Time   01/01/12 12:09 PM      Component Value Range   Sodium 138  135 - 145 mEq/L   Potassium 4.5  3.5 - 5.1 mEq/L   Chloride 104  96 - 112 mEq/L   CO2 27  19 - 32 mEq/L   Glucose, Bld 82  70 - 99 mg/dL   BUN 13  6 - 23 mg/dL   Creatinine, Ser 4.09  0.50 - 1.10 mg/dL   Calcium 9.6  8.4 - 81.1 mg/dL   Total Protein 7.2  6.0 - 8.3 g/dL   Albumin 3.5  3.5 - 5.2 g/dL   AST 16  0 - 37 U/L   ALT 16  0 - 35 U/L   Alkaline Phosphatase 100  39 - 117 U/L   Total Bilirubin 0.2 (*) 0.3 - 1.2 mg/dL   GFR calc non Af Amer >90  >90 mL/min   GFR calc Af  Amer >90  >90 mL/min  CBC     Status: Abnormal   Collection Time   01/01/12 12:09 PM      Component Value Range   WBC 3.8 (*) 4.0 - 10.5 K/uL   RBC 4.59  3.87 - 5.11 MIL/uL   Hemoglobin 10.3 (*) 12.0 - 15.0 g/dL   HCT 16.1 (*) 09.6 - 04.5 %   MCV 73.4 (*) 78.0 - 100.0 fL   MCH 22.4 (*) 26.0 - 34.0 pg   MCHC 30.6  30.0 - 36.0 g/dL   RDW 40.9 (*) 81.1 - 91.4 %   Platelets 319  150 - 400 K/uL    MAU Course  Procedures  MDM No evidence of preeclampsia.    Assessment and Plan  1.  Postpartum hypertension.   Will start HCTZ.  Follow up in women's clinic.  Makylee Sanborn JEHIEL 01/01/2012, 1:08 PM

## 2012-01-12 NOTE — Progress Notes (Signed)
NST 12-02-11 reactive

## 2012-01-16 ENCOUNTER — Ambulatory Visit (INDEPENDENT_AMBULATORY_CARE_PROVIDER_SITE_OTHER): Payer: Medicaid Other | Admitting: Obstetrics and Gynecology

## 2012-01-16 NOTE — Progress Notes (Signed)
Patient ID: Andrea Lucas, female   DOB: November 16, 1975, 36 y.o.   MRN: [id number] 36 yo G5P5 s/p vaginal delivery on 7/4 presenting today for postpartum check. Patient had prenatal care complicated by gestational hypertension and was started on HCTZ on 7/17. Patient is doing well and without complaints. She reports receiving ample support. Patient denies any signs/symptoms of postpartum depression (score of 0 on pp depression scale). Patient is breast feeding exclusively and infant is thriving. Patient has not had her menses yet and she has not had sexual intercourse.  GENERAL: Well-developed, well-nourished female in no acute distress.  HEENT: Normocephalic, atraumatic. Sclerae anicteric.  NECK: Supple. Normal thyroid.  LUNGS: Clear to auscultation bilaterally.  HEART: Regular rate and rhythm. ABDOMEN: Soft, nontender, nondistended. No organomegaly. PELVIC: Normal external female genitalia. Vagina is pink and rugated.  Normal discharge. Normal appearing cervix. Uterus is normal in size.  No adnexal mass or tenderness. EXTREMITIES: No cyanosis, clubbing, or edema, 2+ distal pulses.  A/P 36 yo G5P5 here for postpartum check s/p SVD 7/4 and recently started on HCTZ - Patient medically cleared to resume all activities of daily living - BP today borderline. Will continue HCTZ for now and have patient follow-up with PCP - patient using Micronor for birth control

## 2014-04-18 ENCOUNTER — Encounter (HOSPITAL_COMMUNITY): Payer: Self-pay | Admitting: *Deleted

## 2017-01-09 ENCOUNTER — Ambulatory Visit (INDEPENDENT_AMBULATORY_CARE_PROVIDER_SITE_OTHER): Payer: 59

## 2017-01-09 ENCOUNTER — Encounter (HOSPITAL_COMMUNITY): Payer: Self-pay | Admitting: *Deleted

## 2017-01-09 ENCOUNTER — Ambulatory Visit (HOSPITAL_COMMUNITY)
Admission: EM | Admit: 2017-01-09 | Discharge: 2017-01-09 | Disposition: A | Discharge status: Home / Self Care | Payer: 59 | Attending: Family Medicine | Admitting: Family Medicine

## 2017-01-09 DIAGNOSIS — S40011A Contusion of right shoulder, initial encounter: Secondary | ICD-10-CM

## 2017-01-09 DIAGNOSIS — W19XXXA Unspecified fall, initial encounter: Secondary | ICD-10-CM

## 2017-01-09 DIAGNOSIS — R0789 Other chest pain: Secondary | ICD-10-CM

## 2017-01-09 DIAGNOSIS — M25511 Pain in right shoulder: Secondary | ICD-10-CM

## 2017-01-09 MED ORDER — NAPROXEN 500 MG PO TABS: 500.0000 mg | ORAL_TABLET | Freq: Two times a day (BID) | ORAL | 0 refills | Status: AC

## 2017-01-09 MED ORDER — CYCLOBENZAPRINE HCL 10 MG PO TABS: 10.0000 mg | ORAL_TABLET | Freq: Two times a day (BID) | ORAL | 0 refills | Status: AC | PRN

## 2017-01-09 NOTE — ED Triage Notes (Signed)
Pt reports    She    Larey SeatFell   Down  Some   Steps yesterday        And she has   Pain  r  Elbow     As   Well  Asp  Pain   r    Side

## 2017-01-09 NOTE — ED Provider Notes (Signed)
CSN: [id number]     Arrival date & time 01/09/17  1501 History   None    Chief Complaint  Patient presents with  . Fall   (Consider location/radiation/quality/duration/timing/severity/associated sxs/prior Treatment) Patient c/o right shoulder pain after fall yesterday.  C/o right chest wall pain after fall.  Patient c/o pain with breathing.   The history is provided by the patient.  Fall  This is a new problem. The problem occurs constantly. The problem has not changed since onset.Associated symptoms include chest pain. Nothing aggravates the symptoms. Nothing relieves the symptoms.    Past Medical History:  Diagnosis Date  . Hypertension    History reviewed. No pertinent surgical history. Family History  Problem Relation Age of Onset  . Heart disease Father   . Hypertension Father   . Kidney disease Father    Social History  Substance Use Topics  . Smoking status: Never Smoker  . Smokeless tobacco: Never Used  . Alcohol use No   OB History    Gravida Para Term Preterm AB Living   5 5 5     5    SAB TAB Ectopic Multiple Live Births           5     Review of Systems  Constitutional: Negative.   HENT: Negative.   Eyes: Negative.   Respiratory: Negative.   Cardiovascular: Positive for chest pain.  Gastrointestinal: Negative.   Endocrine: Negative.   Genitourinary: Negative.   Musculoskeletal: Positive for arthralgias.  Allergic/Immunologic: Negative.   Neurological: Negative.   Hematological: Negative.   Psychiatric/Behavioral: Negative.     Allergies  Patient has no known allergies.  Home Medications   Prior to Admission medications   Medication Sig Start Date End Date Taking? Authorizing Provider  cyclobenzaprine (FLEXERIL) 10 MG tablet Take 1 tablet (10 mg total) by mouth 2 (two) times daily as needed for muscle spasms. 01/09/17   Deatra Canterxford, Zubayr Bednarczyk J, FNP  ferrous sulfate (FERROUSUL) 325 (65 FE) MG tablet Take 1 tablet (325 mg total) by mouth 3 (three)  times daily with meals. 12/20/11 12/19/12  Glori LuisSonnenberg, Eric G, MD  hydrochlorothiazide (HYDRODIURIL) 25 MG tablet Take 1 tablet (25 mg total) by mouth daily. 01/01/12 12/31/12  Levie HeritageStinson, Jacob J, DO  ibuprofen (ADVIL,MOTRIN) 600 MG tablet Take 600 mg by mouth every 6 (six) hours as needed. For pain    [provider]  naproxen (NAPROSYN) 500 MG tablet Take 1 tablet (500 mg total) by mouth 2 (two) times daily with a meal. 01/09/17   Emilea Goga, Anselm PancoastWilliam J, FNP  norethindrone (MICRONOR,CAMILA,ERRIN) 0.35 MG tablet Take 1 tablet by mouth daily.    [provider]  pantoprazole (PROTONIX) 40 MG tablet Take 1 tablet (40 mg total) by mouth daily. 12/18/11 12/17/12  Adam PhenixArnold, James G, MD  Prenatal Vit-Fe Fumarate-FA (PRENATAL MULTIVITAMIN) TABS Take 1 tablet by mouth daily.    [provider]   Meds Ordered and Administered this Visit  Medications - No data to display  BP 140/80 (BP Location: Right Arm)   Pulse 78   Temp 98.6 F (37 C) (Oral)   Resp 18   LMP 12/29/2016 (Exact Date)   SpO2 100%  No data found.   Physical Exam  Constitutional: She appears well-developed and well-nourished.  HENT:  Head: Normocephalic and atraumatic.  Eyes: Pupils are equal, round, and reactive to light. Conjunctivae and EOM are normal.  Neck: Normal range of motion. Neck supple.  Cardiovascular: Normal rate, regular rhythm and normal heart sounds.  Pulmonary/Chest: Effort normal and breath sounds normal.  Musculoskeletal: She exhibits tenderness.  TTP right shoulder and right lateral chest wall.    Nursing note and vitals reviewed.   Urgent Care Course     Procedures (including critical care time)  Labs Review Labs Reviewed - No data to display  Imaging Review Dg Ribs Unilateral W/chest Right  Result Date: 01/09/2017 CLINICAL DATA:  Fall downstairs with right-sided shoulder and chest pain, initial encounter EXAM: RIGHT RIBS AND CHEST - 3+ VIEW COMPARISON:  None. FINDINGS: No fracture or  other bone lesions are seen involving the ribs. There is no evidence of pneumothorax or pleural effusion. Both lungs are clear. Heart size and mediastinal contours are within normal limits. IMPRESSION: No acute abnormality noted. Electronically Signed   By: Alcide CleverMark  Lukens M.D.   On: 01/09/2017 15:57   Dg Shoulder Right  Result Date: 01/09/2017 CLINICAL DATA:  Patient fell.  Pain. EXAM: RIGHT SHOULDER - 2+ VIEW COMPARISON:  Chest radiograph earlier today. FINDINGS: There is no evidence of fracture or dislocation. There is no evidence of arthropathy or other focal bone abnormality. Soft tissues are unremarkable. IMPRESSION: Negative. Electronically Signed   By: Elsie StainJohn T Curnes M.D.   On: 01/09/2017 16:07     Visual Acuity Review  Right Eye Distance:   Left Eye Distance:   Bilateral Distance:    Right Eye Near:   Left Eye Near:    Bilateral Near:         MDM   1. Fall, initial encounter   2. Contusion of right shoulder, initial encounter   3. Right-sided chest wall pain    Naprosyn 500 mg one po bid x 10 days #20 Flexeril 10 mg one po bid prn #20      Deatra CanterOxford, Kirubel Aja J, FNP 01/09/17 1621

## 2019-03-05 IMAGING — DX DG SHOULDER 2+V*R*
4 series · 4 of 4 positions shown · non-contrast
Comparison: Chest radiograph earlier today.

CLINICAL DATA: Patient fell.  Pain.

EXAM:
RIGHT SHOULDER - 2+ VIEW

[shoulder ap]
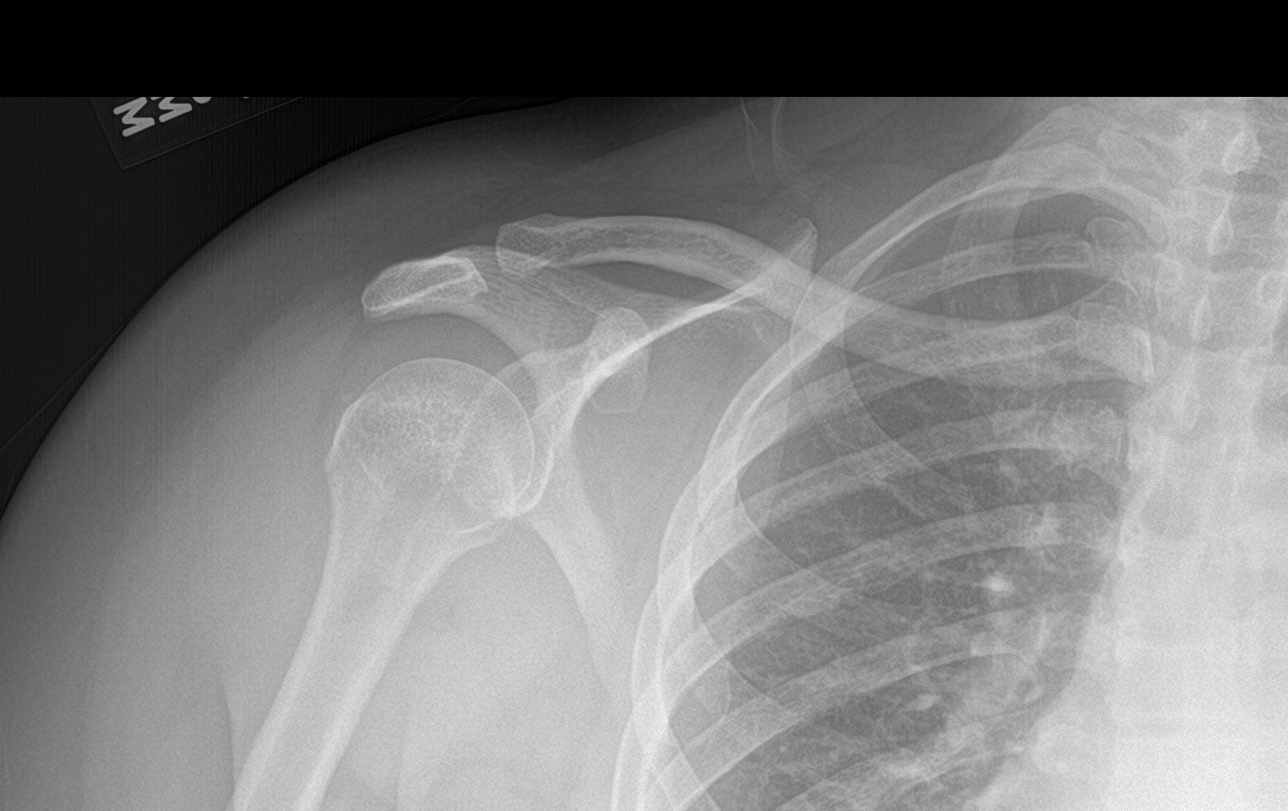

[shoulder grashey]
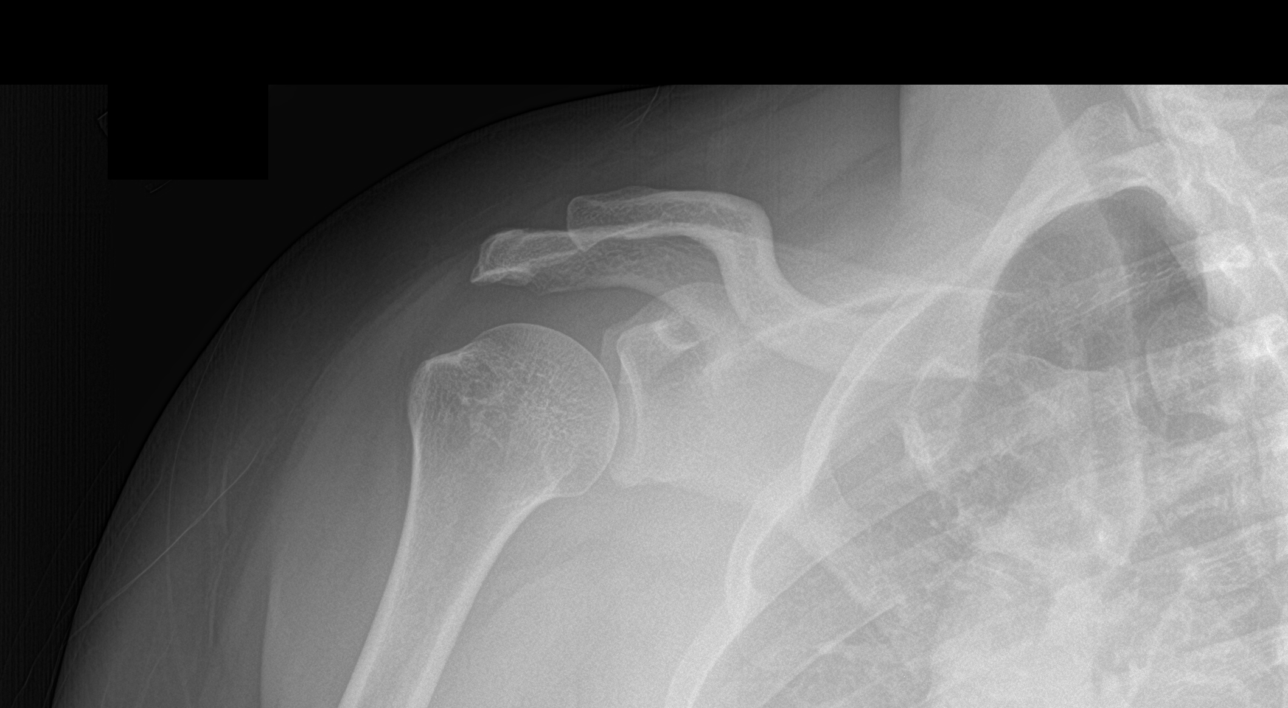

[shoulder y-view]
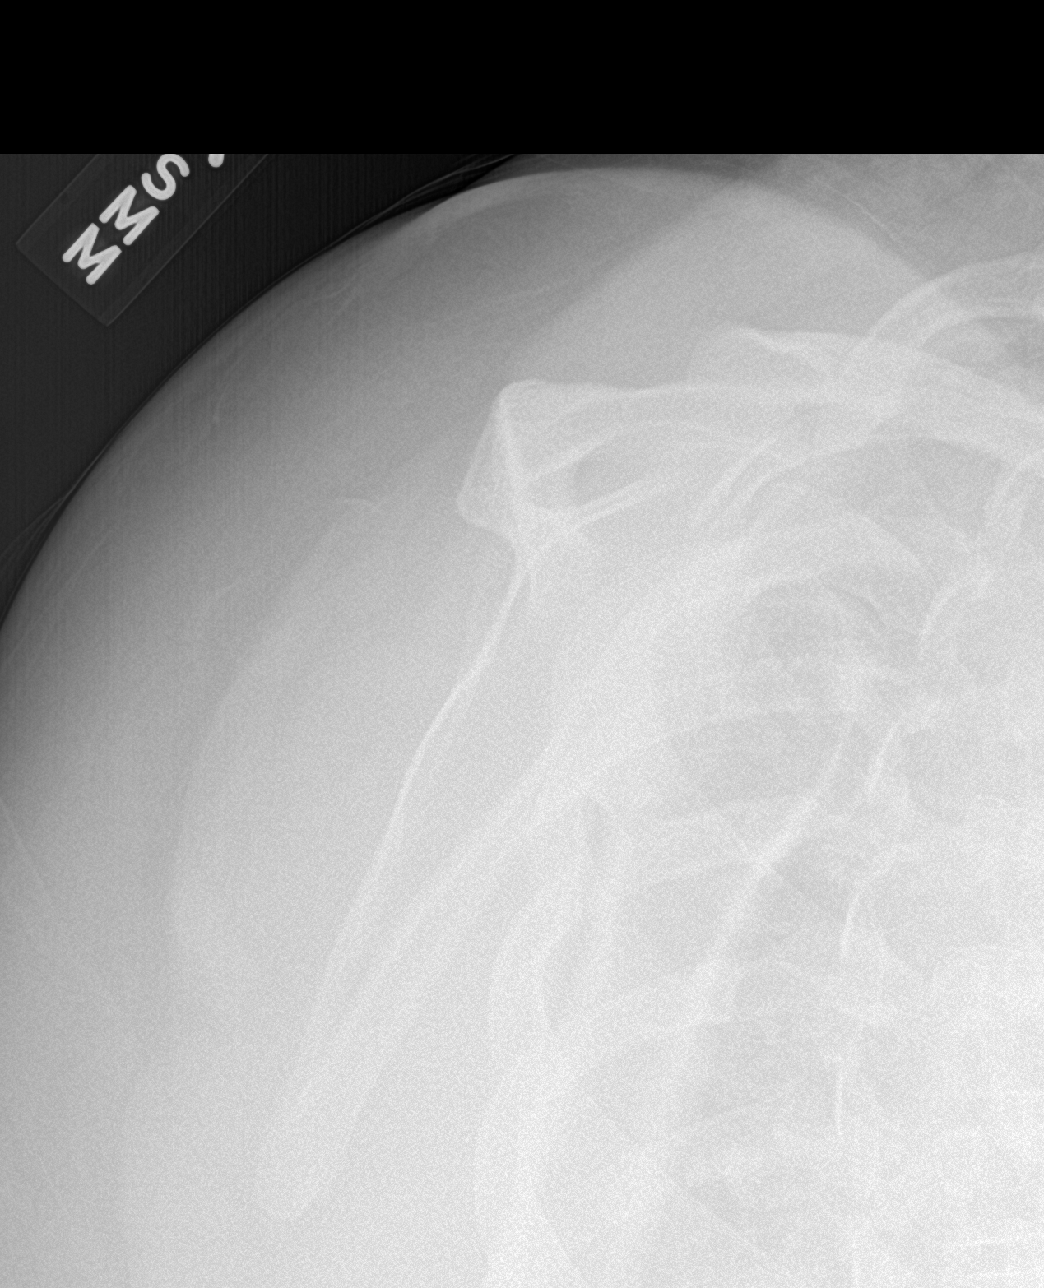

[shoulder axial]
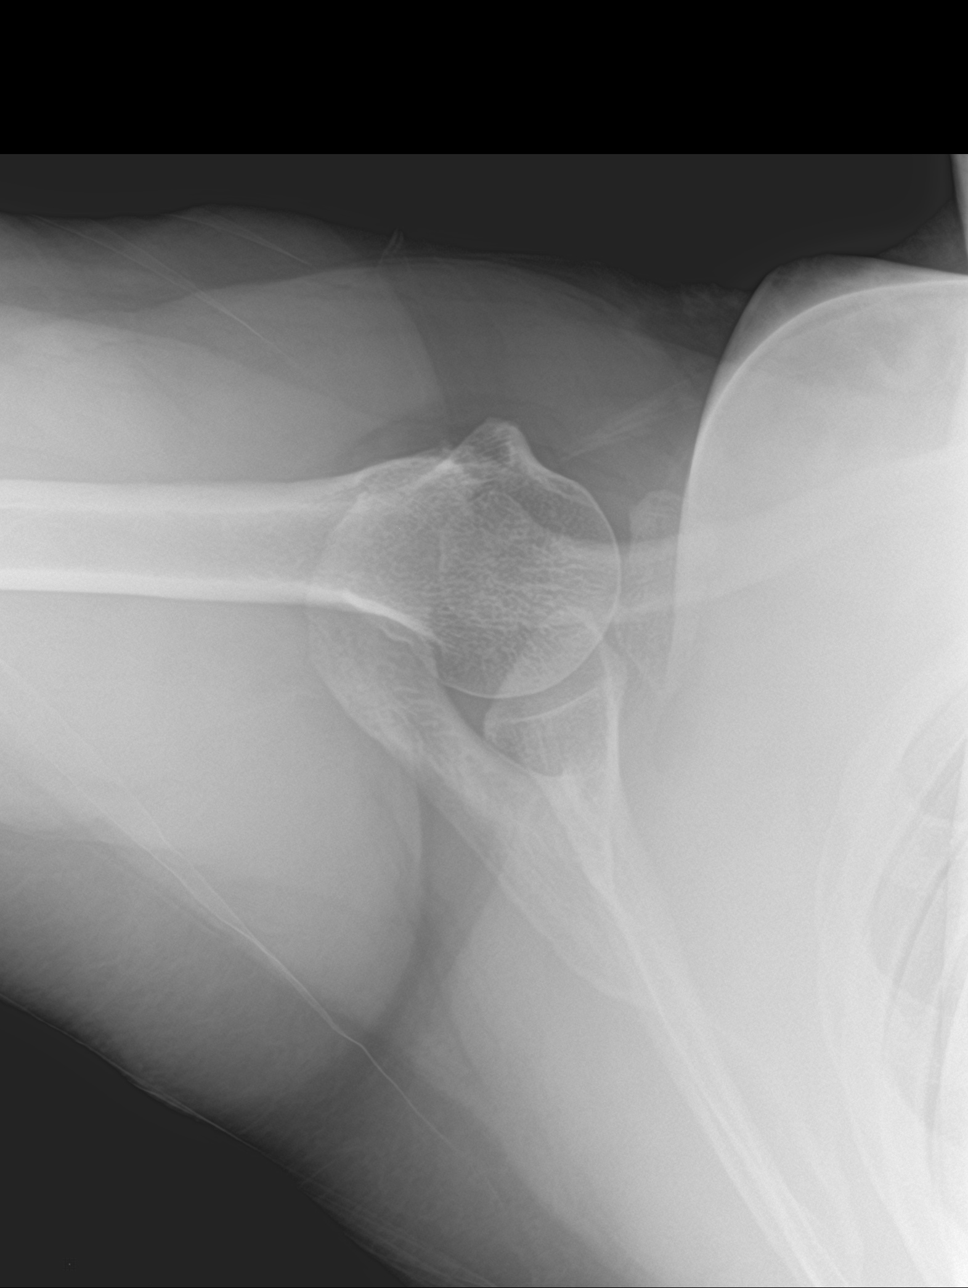

[4 of 4 positions shown; findings below may reference images not displayed]

FINDINGS: There is no evidence of fracture or dislocation. There is no
evidence of arthropathy or other focal bone abnormality. Soft
tissues are unremarkable.
IMPRESSION: Negative.
